# Patient Record
Sex: Female | Born: 1955 | Race: White | Hispanic: No | Marital: Married | Smoking: Former smoker
Health system: Southern US, Community
[De-identification: ages and names within clinical notes are randomized; demographics above are authoritative.]

## PROBLEM LIST (undated history)

## (undated) DIAGNOSIS — K219 Gastro-esophageal reflux disease without esophagitis: Secondary | ICD-10-CM

## (undated) DIAGNOSIS — E039 Hypothyroidism, unspecified: Secondary | ICD-10-CM

## (undated) DIAGNOSIS — Z9889 Other specified postprocedural states: Secondary | ICD-10-CM

## (undated) DIAGNOSIS — R112 Nausea with vomiting, unspecified: Secondary | ICD-10-CM

## (undated) DIAGNOSIS — F329 Major depressive disorder, single episode, unspecified: Secondary | ICD-10-CM

## (undated) DIAGNOSIS — G43909 Migraine, unspecified, not intractable, without status migrainosus: Secondary | ICD-10-CM

## (undated) DIAGNOSIS — M1990 Unspecified osteoarthritis, unspecified site: Secondary | ICD-10-CM

## (undated) DIAGNOSIS — F419 Anxiety disorder, unspecified: Secondary | ICD-10-CM

## (undated) DIAGNOSIS — J189 Pneumonia, unspecified organism: Secondary | ICD-10-CM

## (undated) DIAGNOSIS — I10 Essential (primary) hypertension: Secondary | ICD-10-CM

## (undated) DIAGNOSIS — Z973 Presence of spectacles and contact lenses: Secondary | ICD-10-CM

## (undated) DIAGNOSIS — F32A Depression, unspecified: Secondary | ICD-10-CM

## (undated) HISTORY — PX: COLONOSCOPY: SHX174

## (undated) HISTORY — PX: KNEE ARTHROSCOPY: SUR90

---

## 2001-04-03 HISTORY — PX: ABDOMINAL HYSTERECTOMY: SHX81

## 2002-04-03 HISTORY — PX: LAPAROSCOPIC CHOLECYSTECTOMY: SUR755

## 2004-02-10 ENCOUNTER — Ambulatory Visit: Payer: Self-pay | Admitting: Internal Medicine

## 2004-03-09 ENCOUNTER — Ambulatory Visit: Payer: Self-pay | Admitting: Internal Medicine

## 2012-04-03 HISTORY — PX: CARPAL TUNNEL RELEASE: SHX101

## 2018-05-14 NOTE — Progress Notes (Signed)
Please place orders in Epic as patient is being scheduled for a pre-op appointment! Thank you! 

## 2018-05-17 NOTE — Patient Instructions (Signed)
Lisabeth RegisterJudy Etienne  1956-01-17     Your procedure is scheduled on:  05-24-2018    [facility],  Report to admitting at  10:45 AM    Call this number if you have problems the morning of surgery [phone]        Remember: Do not eat food or drink liquids :After Midnight.    BRUSH YOUR TEETH MORNING OF SURGERY AND RINSE YOUR MOUTH OUT, NO CHEWING GUM CANDY OR MINTS.         Take these medicines the morning of surgery with A SIP OF WATER:  Premarin,  Lamotrigine (lamictal), Levothyroxine (synthroid), Omeprazole (prilosec)                                   You may not have any metal on your body including hair pins and               piercings  Do not wear jewelry, make-up, lotions, powders or perfumes, deodorant              Do not wear nail polish.  Do not shave  48 hours prior to surgery.                Do not bring valuables to the hospital. [facility] IS NOT             RESPONSIBLE   FOR VALUABLES.  Contacts, dentures or bridgework may not be worn into surgery.  Leave suitcase in the car. After surgery it may be brought to your room.     _____________________________________________________________________             [facility] - Preparing for Surgery Before surgery, you can play an important role.  Because skin is not sterile, your skin needs to be as free of germs as possible.  You can reduce the number of germs on your skin by washing with CHG (chlorahexidine gluconate) soap before surgery.  CHG is an antiseptic cleaner which kills germs and bonds with the skin to continue killing germs even after washing. Please DO NOT use if you have an allergy to CHG or antibacterial soaps.  If your skin becomes reddened/irritated stop using the CHG and inform your nurse when you arrive at Short Stay. Do not shave (including legs and underarms) for at least 48 hours prior to the first CHG shower.  You may shave your  face/neck. Please follow these instructions carefully:  1.  Shower with CHG Soap the night before surgery and the  morning of Surgery.  2.  If you choose to wash your hair, wash your hair first as usual with your  normal  shampoo.  3.  After you shampoo, rinse your hair and body thoroughly to remove the  shampoo.                            4.  Use CHG as you would any other liquid soap.  You can apply chg directly  to the skin and wash                       Gently with a scrungie or clean washcloth.  5.  Apply the CHG Soap to your body ONLY FROM THE  NECK DOWN.   Do not use on face/ open                           Wound or open sores. Avoid contact with eyes, ears mouth and genitals (private parts).                       Wash face,  Genitals (private parts) with your normal soap.             6.  Wash thoroughly, paying special attention to the area where your surgery  will be performed.  7.  Thoroughly rinse your body with warm water from the neck down.  8.  DO NOT shower/wash with your normal soap after using and rinsing off  the CHG Soap.             9.  Pat yourself dry with a clean towel.            10.  Wear clean pajamas.            11.  Place clean sheets on your bed the night of your first shower and do not  sleep with pets. Day of Surgery : Do not apply any lotions/deodorants the morning of surgery.  Please wear clean clothes to the hospital/surgery center.  FAILURE TO FOLLOW THESE INSTRUCTIONS MAY RESULT IN THE CANCELLATION OF YOUR SURGERY PATIENT SIGNATURE_________________________________  NURSE SIGNATURE__________________________________  ________________________________________________________________________   Rogelia Mire  An incentive spirometer is a tool that can help keep your lungs clear and active. This tool measures how well you are filling your lungs with each breath. Taking long deep breaths may help reverse or decrease the chance of developing breathing  (pulmonary) problems (especially infection) following:  A long period of time when you are unable to move or be active. BEFORE THE PROCEDURE   If the spirometer includes an indicator to show your best effort, your nurse or respiratory therapist will set it to a desired goal.  If possible, sit up straight or lean slightly forward. Try not to slouch.  Hold the incentive spirometer in an upright position. INSTRUCTIONS FOR USE  1. Sit on the edge of your bed if possible, or sit up as far as you can in bed or on a chair. 2. Hold the incentive spirometer in an upright position. 3. Breathe out normally. 4. Place the mouthpiece in your mouth and seal your lips tightly around it. 5. Breathe in slowly and as deeply as possible, raising the piston or the ball toward the top of the column. 6. Hold your breath for 3-5 seconds or for as long as possible. Allow the piston or ball to fall to the bottom of the column. 7. Remove the mouthpiece from your mouth and breathe out normally. 8. Rest for a few seconds and repeat Steps 1 through 7 at least 10 times every 1-2 hours when you are awake. Take your time and take a few normal breaths between deep breaths. 9. The spirometer may include an indicator to show your best effort. Use the indicator as a goal to work toward during each repetition. 10. After each set of 10 deep breaths, practice coughing to be sure your lungs are clear. If you have an incision (the cut made at the time of surgery), support your incision when coughing by placing a pillow or rolled up towels firmly against it. Once you are able to get out  of bed, walk around indoors and cough well. You may stop using the incentive spirometer when instructed by your caregiver.  RISKS AND COMPLICATIONS  Take your time so you do not get dizzy or light-headed.  If you are in pain, you may need to take or ask for pain medication before doing incentive spirometry. It is harder to take a deep breath if you  are having pain. AFTER USE  Rest and breathe slowly and easily.  It can be helpful to keep track of a log of your progress. Your caregiver can provide you with a simple table to help with this. If you are using the spirometer at home, follow these instructions: SEEK MEDICAL CARE IF:   You are having difficultly using the spirometer.  You have trouble using the spirometer as often as instructed.  Your pain medication is not giving enough relief while using the spirometer.  You develop fever of 100.5 F (38.1 C) or higher. SEEK IMMEDIATE MEDICAL CARE IF:   You cough up bloody sputum that had not been present before.  You develop fever of 102 F (38.9 C) or greater.  You develop worsening pain at or near the incision site. MAKE SURE YOU:   Understand these instructions.  Will watch your condition.  Will get help right away if you are not doing well or get worse. Document Released: 07/31/2006 Document Revised: 06/12/2011 Document Reviewed: 10/01/2006 Exit[employer] Patient Information 2014 ExitCare, [place].   ________________________________________________________________________  WHAT IS A BLOOD TRANSFUSION? Blood Transfusion Information  A transfusion is the replacement of blood or some of its parts. Blood is made up of multiple cells which provide different functions.  Red blood cells carry oxygen and are used for blood loss replacement.  White blood cells fight against infection.  Platelets control bleeding.  Plasma helps clot blood.  Other blood products are available for specialized needs, such as hemophilia or other clotting disorders. BEFORE THE TRANSFUSION  Who gives blood for transfusions?   Healthy volunteers who are fully evaluated to make sure their blood is safe. This is blood bank blood. Transfusion therapy is the safest it has ever been in the practice of medicine. Before blood is taken from a donor, a complete history is taken to make sure that person has  no history of diseases nor engages in risky social behavior (examples are intravenous drug use or sexual activity with multiple partners). The donor's travel history is screened to minimize risk of transmitting infections, such as malaria. The donated blood is tested for signs of infectious diseases, such as HIV and hepatitis. The blood is then tested to be sure it is compatible with you in order to minimize the chance of a transfusion reaction. If you or a relative donates blood, this is often done in anticipation of surgery and is not appropriate for emergency situations. It takes many days to process the donated blood. RISKS AND COMPLICATIONS Although transfusion therapy is very safe and saves many lives, the main dangers of transfusion include:   Getting an infectious disease.  Developing a transfusion reaction. This is an allergic reaction to something in the blood you were given. Every precaution is taken to prevent this. The decision to have a blood transfusion has been considered carefully by your caregiver before blood is given. Blood is not given unless the benefits outweigh the risks. AFTER THE TRANSFUSION  Right after receiving a blood transfusion, you will usually feel much better and more energetic. This is especially true if your red  blood cells have gotten low (anemic). The transfusion raises the level of the red blood cells which carry oxygen, and this usually causes an energy increase.  The nurse administering the transfusion will monitor you carefully for complications. HOME CARE INSTRUCTIONS  No special instructions are needed after a transfusion. You may find your energy is better. Speak with your caregiver about any limitations on activity for underlying diseases you may have. SEEK MEDICAL CARE IF:   Your condition is not improving after your transfusion.  You develop redness or irritation at the intravenous (IV) site. SEEK IMMEDIATE MEDICAL CARE IF:  Any of the following  symptoms occur over the next 12 hours:  Shaking chills.  You have a temperature by mouth above 102 F (38.9 C), not controlled by medicine.  Chest, back, or muscle pain.  People around you feel you are not acting correctly or are confused.  Shortness of breath or difficulty breathing.  Dizziness and fainting.  You get a rash or develop hives.  You have a decrease in urine output.  Your urine turns a dark color or changes to pink, red, or brown. Any of the following symptoms occur over the next 10 days:  You have a temperature by mouth above 102 F (38.9 C), not controlled by medicine.  Shortness of breath.  Weakness after normal activity.  The white part of the eye turns yellow (jaundice).  You have a decrease in the amount of urine or are urinating less often.  Your urine turns a dark color or changes to pink, red, or brown. Document Released: 03/17/2000 Document Revised: 06/12/2011 Document Reviewed: 11/04/2007 [facility] Patient Information 2014 [place], [place].  _______________________________________________________________________

## 2018-05-22 ENCOUNTER — Encounter (HOSPITAL_COMMUNITY)
Admission: RE | Admit: 2018-05-22 | Discharge: 2018-05-22 | Disposition: A | Discharge status: Home / Self Care | Source: Ambulatory Visit | Attending: Orthopedic Surgery | Admitting: Orthopedic Surgery

## 2018-05-22 ENCOUNTER — Encounter (HOSPITAL_COMMUNITY): Payer: Self-pay | Admitting: *Deleted

## 2018-05-22 ENCOUNTER — Other Ambulatory Visit: Payer: Self-pay

## 2018-05-22 ENCOUNTER — Encounter (HOSPITAL_COMMUNITY): Payer: Self-pay | Admitting: Physician Assistant

## 2018-05-22 DIAGNOSIS — Z01818 Encounter for other preprocedural examination: Secondary | ICD-10-CM | POA: Diagnosis present

## 2018-05-22 HISTORY — DX: Migraine, unspecified, not intractable, without status migrainosus: G43.909

## 2018-05-22 HISTORY — DX: Gastro-esophageal reflux disease without esophagitis: K21.9

## 2018-05-22 HISTORY — DX: Other specified postprocedural states: Z98.890

## 2018-05-22 HISTORY — DX: Other specified postprocedural states: R11.2

## 2018-05-22 HISTORY — DX: Unspecified osteoarthritis, unspecified site: M19.90

## 2018-05-22 HISTORY — DX: Essential (primary) hypertension: I10

## 2018-05-22 HISTORY — DX: Presence of spectacles and contact lenses: Z97.3

## 2018-05-22 HISTORY — DX: Hypothyroidism, unspecified: E03.9

## 2018-05-22 LAB — CBC WITH DIFFERENTIAL/PLATELET
Abs Immature Granulocytes: 0.03 10*3/uL (ref 0.00–0.07)
Basophils Absolute: 0.1 10*3/uL (ref 0.0–0.1)
Basophils Relative: 1 %
Eosinophils Absolute: 0.2 10*3/uL (ref 0.0–0.5)
Eosinophils Relative: 3 %
HCT: 42.4 % (ref 36.0–46.0)
Hemoglobin: 13.7 g/dL (ref 12.0–15.0)
Immature Granulocytes: 1 %
Lymphocytes Relative: 30 %
Lymphs Abs: 1.9 10*3/uL (ref 0.7–4.0)
MCH: 32.4 pg (ref 26.0–34.0)
MCHC: 32.3 g/dL (ref 30.0–36.0)
MCV: 100.2 fL — ABNORMAL HIGH (ref 80.0–100.0)
Monocytes Absolute: 0.7 10*3/uL (ref 0.1–1.0)
Monocytes Relative: 11 %
Neutro Abs: 3.4 10*3/uL (ref 1.7–7.7)
Neutrophils Relative %: 54 %
Platelets: 198 10*3/uL (ref 150–400)
RBC: 4.23 MIL/uL (ref 3.87–5.11)
RDW: 12.5 % (ref 11.5–15.5)
WBC: 6.2 10*3/uL (ref 4.0–10.5)
nRBC: 0 % (ref 0.0–0.2)

## 2018-05-22 LAB — URINALYSIS, ROUTINE W REFLEX MICROSCOPIC
Bilirubin Urine: NEGATIVE
Glucose, UA: NEGATIVE mg/dL
Hgb urine dipstick: NEGATIVE
Ketones, ur: NEGATIVE mg/dL
Leukocytes,Ua: NEGATIVE
Nitrite: NEGATIVE
Protein, ur: NEGATIVE mg/dL
Specific Gravity, Urine: 1.009 (ref 1.005–1.030)
pH: 7 (ref 5.0–8.0)

## 2018-05-22 LAB — PROTIME-INR
INR: 1.01
Prothrombin Time: 13.2 seconds (ref 11.4–15.2)

## 2018-05-22 LAB — APTT: aPTT: 31 seconds (ref 24–36)

## 2018-05-22 LAB — SURGICAL PCR SCREEN
MRSA, PCR: NEGATIVE
Staphylococcus aureus: NEGATIVE

## 2018-05-22 LAB — ABO/RH: ABO/RH(D): A POS

## 2018-05-22 NOTE — Progress Notes (Addendum)
Per pt had lab work and ekg done at [facility], [facility], yesterday 05-21-2018.  Called via phone and requested lab results and ekg.  Per office no ekg in pt chart but lab work was done (told [facility],cmp was done) , to fax results.  Received lab results dated 05-21-2018 and placed with chart.  ADDENDUM:  Final EKG dated 05-22-2018  In epic.

## 2018-05-23 MED ORDER — BUPIVACAINE LIPOSOME 1.3 % IJ SUSP
20.0000 mL | INTRAMUSCULAR | Status: DC
  Filled 2018-05-23: qty 20

## 2018-05-24 ENCOUNTER — Inpatient Hospital Stay (HOSPITAL_COMMUNITY)
Admission: RE | Admit: 2018-05-24 | Payer: PRIVATE HEALTH INSURANCE | Source: Home / Self Care | Admitting: Orthopedic Surgery

## 2018-05-24 ENCOUNTER — Encounter (HOSPITAL_COMMUNITY): Admission: RE | Payer: Self-pay | Source: Home / Self Care

## 2018-05-24 LAB — TYPE AND SCREEN
ABO/RH(D): A POS
Antibody Screen: NEGATIVE

## 2018-05-24 SURGERY — ARTHROPLASTY, KNEE, TOTAL
Anesthesia: Choice | Laterality: Left

## 2018-05-27 ENCOUNTER — Ambulatory Visit (INDEPENDENT_AMBULATORY_CARE_PROVIDER_SITE_OTHER): Admitting: Allergy and Immunology

## 2018-05-27 ENCOUNTER — Encounter: Payer: Self-pay | Admitting: Allergy and Immunology

## 2018-05-27 VITALS — BP 104/68 | HR 84 | Temp 98.5°F | Resp 22 | Ht 62.2 in | Wt 178.0 lb

## 2018-05-27 DIAGNOSIS — L230 Allergic contact dermatitis due to metals: Secondary | ICD-10-CM | POA: Diagnosis not present

## 2018-05-27 NOTE — Progress Notes (Signed)
Dear Dimitri Ped,  Thank you for referring Kaili Bieber to the [facility] on 05/27/2018.   Below is a summation of this patient's evaluation and recommendations.  Thank you for your referral. I will keep you informed about this patient's response to treatment.   If you have any questions please do not hesitate to contact me.   Sincerely,  Jessica Priest, MD Allergy / Immunology [facility]   ______________________________________________________________________    NEW PATIENT NOTE  Referring Provider: Dimitri Ped, PA-C Primary Provider: Marylen Ponto, MD Date of office visit: 05/27/2018    Subjective:   Chief Complaint:  Kathryn Hicks (DOB: [birth year]) is a 63 y.o. female who presents to the clinic on 05/27/2018 with a chief complaint of Other (Possible reaction to metals.) .     HPI: Maylei presents to this clinic in evaluation of possible metal allergy.  She will be having a knee replacement sometime in the near future.  She has a history of developing red swollen scaly blistered areas when using various types of jewelry.  She has found that she can only wear gold jewelry at this point in time.  Past Medical History:  Diagnosis Date  . GERD (gastroesophageal reflux disease)   . Hypertension   . Hypothyroidism    followed by pcp  . Migraines   . OA (osteoarthritis)    knees  . PONV (postoperative nausea and vomiting)   . Wears glasses     Past Surgical History:  Procedure Laterality Date  . ABDOMINAL HYSTERECTOMY  2003    w/  Bilateral Salpingo-oophorectomy  . CARPAL TUNNEL RELEASE Left 2014  . CESAREAN SECTION  x2  last one 17  . KNEE ARTHROSCOPY Left 06-21-2017   dr Darrelyn Hillock  . LAPAROSCOPIC CHOLECYSTECTOMY  2004    Allergies as of 05/27/2018      Reactions   Butalbital Hives   Other reaction(s): Other (See Comments) unknown   Sporanos  [itraconazole] Hives, Rash   Welts full body      Medication List      almotriptan 12.5 MG tablet Commonly known as:  AXERT Take 12.5 mg by mouth as needed for migraine. may repeat in 2 hours if needed   ALPRAZolam 1 MG tablet Commonly known as:  XANAX Take 0.5 mg by mouth at bedtime as needed for anxiety.   Biotin 5000 MCG Tabs Take 5,000 mcg by mouth daily.   calcium carbonate 1250 (500 Ca) MG tablet Commonly known as:  OS-CAL - dosed in mg of elemental calcium Take 1 tablet by mouth daily with breakfast.   cholecalciferol 25 MCG (1000 UT) tablet Commonly known as:  VITAMIN D3 Take 1,000 Units by mouth daily.   esomeprazole 20 MG capsule Commonly known as:  NEXIUM Take 20 mg by mouth daily as needed.   estrogens (conjugated) 0.9 MG tablet Commonly known as:  PREMARIN Take 0.9 mg by mouth daily.   lamoTRIgine 150 MG tablet Commonly known as:  LAMICTAL Take 150 mg by mouth 2 (two) times daily.   levothyroxine 25 MCG tablet Commonly known as:  SYNTHROID, LEVOTHROID Take 25 mcg by mouth daily before breakfast.   multivitamin with minerals Tabs tablet Take 1 tablet by mouth daily.   olmesartan-hydrochlorothiazide 40-25 MG tablet Commonly known as:  BENICAR HCT Take 1 tablet by mouth daily.   OSTEO BI-FLEX TRIPLE STRENGTH PO Take 1 tablet by mouth 2 (two)  times daily.   pseudoephedrine 120 MG 12 hr tablet Commonly known as:  SUDAFED Take 120 mg by mouth every 12 (twelve) hours as needed for congestion.   vitamin C 1000 MG tablet Take 1,000 mg by mouth daily.       Review of systems negative except as noted in HPI / PMHx or noted below:  Review of Systems  Constitutional: Negative.   HENT: Negative.   Eyes: Negative.   Respiratory: Negative.   Cardiovascular: Negative.   Gastrointestinal: Negative.   Genitourinary: Negative.   Musculoskeletal: Negative.   Skin: Negative.   Neurological: Negative.   Endo/Heme/Allergies: Negative.     Psychiatric/Behavioral: Negative.     Family History  Problem Relation Age of Onset  . Prostate cancer Father   . Heart attack Father   . Wilm's tumor Daughter     Social History   Socioeconomic History  . Marital status: Married    Spouse name: Not on file  . Number of children: Not on file  . Years of education: Not on file  . Highest education level: Not on file  Occupational History  . Not on file  Social Needs  . Financial resource strain: Not on file  . Food insecurity:    Worry: Not on file    Inability: Not on file  . Transportation needs:    Medical: Not on file    Non-medical: Not on file  Tobacco Use  . Smoking status: Former Smoker    Years: 10.00    Types: Cigarettes    Last attempt to quit: 05/22/1977    Years since quitting: 41.0  . Smokeless tobacco: Never Used  Substance and Sexual Activity  . Alcohol use: Never    Frequency: Never  . Drug use: Not Currently    Comment: per pt in age 17s,  none since  . Sexual activity: Not on file  Lifestyle  . Physical activity:    Days per week: Not on file    Minutes per session: Not on file  . Stress: Not on file  Relationships  . Social connections:    Talks on phone: Not on file    Gets together: Not on file    Attends religious service: Not on file    Active member of club or organization: Not on file    Attends meetings of clubs or organizations: Not on file    Relationship status: Not on file  . Intimate partner violence:    Fear of current or ex partner: Not on file    Emotionally abused: Not on file    Physically abused: Not on file    Forced sexual activity: Not on file  Other Topics Concern  . Not on file  Social History Narrative  . Not on file    Environmental and Social history  Lives in a house with a dry environment, dogs located inside the household, carpet in the bedroom, plastic on the bed, no plastic on the pillow, no smokers located inside the household.  Objective:    Vitals:   05/27/18 0948  BP: 104/68  Pulse: 84  Resp: (!) 22  Temp: 98.5 F (36.9 C)  SpO2: 96%   Height: 5' 2.2" (158 cm) Weight: 178 lb (80.7 kg)  Physical Exam Constitutional:      Appearance: She is not diaphoretic.  HENT:     Head: Normocephalic. No right periorbital erythema or left periorbital erythema.     Right Ear: Tympanic membrane, ear canal  and external ear normal.     Left Ear: Tympanic membrane, ear canal and external ear normal.     Nose: Nose normal. No mucosal edema or rhinorrhea.     Mouth/Throat:     Pharynx: No oropharyngeal exudate.  Eyes:     General: Lids are normal.     Conjunctiva/sclera: Conjunctivae normal.     Pupils: Pupils are equal, round, and reactive to light.  Neck:     Thyroid: No thyromegaly.     Trachea: Trachea normal. No tracheal deviation.  Cardiovascular:     Rate and Rhythm: Normal rate and regular rhythm.     Heart sounds: Normal heart sounds, S1 normal and S2 normal. No murmur.  Pulmonary:     Effort: Pulmonary effort is normal. No respiratory distress.     Breath sounds: No stridor. No wheezing or rales.  Chest:     Chest wall: No tenderness.  Abdominal:     General: There is no distension.     Palpations: Abdomen is soft. There is no mass.     Tenderness: There is no abdominal tenderness. There is no guarding or rebound.  Musculoskeletal:        General: No tenderness.  Lymphadenopathy:     Head:     Right side of head: No tonsillar adenopathy.     Left side of head: No tonsillar adenopathy.     Cervical: No cervical adenopathy.  Skin:    Coloration: Skin is not pale.     Findings: No erythema or rash.     Nails: There is no clubbing.   Neurological:     Mental Status: She is alert.     Diagnostics: Metal patch testing was applied to back and initial read will take place in 48 hours.  Assessment and Plan:    1. Allergic contact dermatitis due to metals     Kemoria has had metal patch testing placed on  her back and she will return to this clinic in 48 hours for her initial read.  Further recommendations will be forthcoming based upon the results of this testing.   Jessica Priest, MD Allergy / Immunology [facility] [place]

## 2018-05-28 ENCOUNTER — Encounter: Payer: Self-pay | Admitting: Allergy and Immunology

## 2018-05-29 ENCOUNTER — Encounter: Payer: Self-pay | Admitting: Allergy and Immunology

## 2018-05-29 ENCOUNTER — Ambulatory Visit: Admitting: Allergy and Immunology

## 2018-05-29 DIAGNOSIS — L230 Allergic contact dermatitis due to metals: Secondary | ICD-10-CM

## 2018-05-30 ENCOUNTER — Encounter: Payer: Self-pay | Admitting: Allergy and Immunology

## 2018-05-30 NOTE — Progress Notes (Signed)
Kathryn Hicks returns to this clinic in reevaluation of her possible contact allergy directed against metal.  She is here today to have her 48-hour patch test read performed.  There were no cutaneous reactions to any of the metals placed on her back 48 hours ago.  She will return to this clinic for a 7-day patch test read.

## 2018-06-03 ENCOUNTER — Encounter: Payer: Self-pay | Admitting: Allergy and Immunology

## 2018-06-03 ENCOUNTER — Ambulatory Visit: Admitting: Allergy and Immunology

## 2018-06-03 DIAGNOSIS — L230 Allergic contact dermatitis due to metals: Secondary | ICD-10-CM

## 2018-06-03 NOTE — Progress Notes (Signed)
Derisha returns to this clinic to have a 7-day patch test read completed.  There was no evidence of any reaction directed against chromium chloride 1%, cobalt chloride hexahydrate 1%, Molybdenum chloride 0.5%, Tantal 1%, nickel sulfate hexahydrate 5%, potassium dichromate 0.25%, copper sulfate Penta hydrate 2%, titanium 0.1%, manganese chloride 0.5%.

## 2018-06-04 ENCOUNTER — Other Ambulatory Visit: Payer: Self-pay | Admitting: Surgical

## 2018-06-05 ENCOUNTER — Encounter (HOSPITAL_COMMUNITY): Payer: Self-pay

## 2018-06-05 NOTE — Patient Instructions (Signed)
Your procedure is scheduled on: Tuesday, June 11, 2018   Surgery Time:  12:45PM-2:45PM   Report to [facility] Main  Entrance    Report to admitting at 10:15 AM   Call this number if you have problems the morning of surgery [phone]   Do not eat food or drink liquids :After Midnight.   Brush your teeth the morning of surgery.   Do NOT smoke after Midnight   Take these medicines the morning of surgery with A SIP OF WATER: Esomeprazole, Premarin, Lamotrigine, Levothyroxine                               You may not have any metal on your body including hair pins, jewelry, and body piercings             Do not wear make-up, lotions, powders, perfumes/cologne, or deodorant             Do not wear nail polish.  Do not shave  48 hours prior to surgery.               Do not bring valuables to the hospital. [facility] IS NOT             RESPONSIBLE   FOR VALUABLES.   Contacts, dentures or bridgework may not be worn into surgery.   Leave suitcase in the car. After surgery it may be brought to your room.   Special Instructions: Bring a copy of your healthcare power of attorney and living will documents         the day of surgery if you haven't scanned them in before.              Please read over the following fact sheets you were given:  [facility] - Preparing for Surgery Before surgery, you can play an important role.  Because skin is not sterile, your skin needs to be as free of germs as possible.  You can reduce the number of germs on your skin by washing with CHG (chlorahexidine gluconate) soap before surgery.  CHG is an antiseptic cleaner which kills germs and bonds with the skin to continue killing germs even after washing. Please DO NOT use if you have an allergy to CHG or antibacterial soaps.  If your skin becomes reddened/irritated stop using the CHG and inform your nurse when you arrive at Short Stay. Do not shave (including legs and underarms) for at least  48 hours prior to the first CHG shower.  You may shave your face/neck.  Please follow these instructions carefully:  1.  Shower with CHG Soap the night before surgery and the  morning of surgery.  2.  If you choose to wash your hair, wash your hair first as usual with your normal  shampoo.  3.  After you shampoo, rinse your hair and body thoroughly to remove the shampoo.                             4.  Use CHG as you would any other liquid soap.  You can apply chg directly to the skin and wash.  Gently with a scrungie or clean washcloth.  5.  Apply the CHG Soap to your body ONLY FROM THE NECK DOWN.   Do   not use on face/ open  Wound or open sores. Avoid contact with eyes, ears mouth and   genitals (private parts).                       Wash face,  Genitals (private parts) with your normal soap.             6.  Wash thoroughly, paying special attention to the area where your    surgery  will be performed.  7.  Thoroughly rinse your body with warm water from the neck down.  8.  DO NOT shower/wash with your normal soap after using and rinsing off the CHG Soap.                9.  Pat yourself dry with a clean towel.            10.  Wear clean pajamas.            11.  Place clean sheets on your bed the night of your first shower and do not  sleep with pets. Day of Surgery : Do not apply any lotions/deodorants the morning of surgery.  Please wear clean clothes to the hospital/surgery center.  FAILURE TO FOLLOW THESE INSTRUCTIONS MAY RESULT IN THE CANCELLATION OF YOUR SURGERY  PATIENT SIGNATURE_________________________________  NURSE SIGNATURE__________________________________  ________________________________________________________________________   Adam Phenix  An incentive spirometer is a tool that can help keep your lungs clear and active. This tool measures how well you are filling your lungs with each breath. Taking long deep breaths may help reverse  or decrease the chance of developing breathing (pulmonary) problems (especially infection) following:  A long period of time when you are unable to move or be active. BEFORE THE PROCEDURE   If the spirometer includes an indicator to show your best effort, your nurse or respiratory therapist will set it to a desired goal.  If possible, sit up straight or lean slightly forward. Try not to slouch.  Hold the incentive spirometer in an upright position. INSTRUCTIONS FOR USE  1. Sit on the edge of your bed if possible, or sit up as far as you can in bed or on a chair. 2. Hold the incentive spirometer in an upright position. 3. Breathe out normally. 4. Place the mouthpiece in your mouth and seal your lips tightly around it. 5. Breathe in slowly and as deeply as possible, raising the piston or the ball toward the top of the column. 6. Hold your breath for 3-5 seconds or for as long as possible. Allow the piston or ball to fall to the bottom of the column. 7. Remove the mouthpiece from your mouth and breathe out normally. 8. Rest for a few seconds and repeat Steps 1 through 7 at least 10 times every 1-2 hours when you are awake. Take your time and take a few normal breaths between deep breaths. 9. The spirometer may include an indicator to show your best effort. Use the indicator as a goal to work toward during each repetition. 10. After each set of 10 deep breaths, practice coughing to be sure your lungs are clear. If you have an incision (the cut made at the time of surgery), support your incision when coughing by placing a pillow or rolled up towels firmly against it. Once you are able to get out of bed, walk around indoors and cough well. You may stop using the incentive spirometer when instructed by your caregiver.  RISKS AND COMPLICATIONS  Take your time  so you do not get dizzy or light-headed.  If you are in pain, you may need to take or ask for pain medication before doing incentive  spirometry. It is harder to take a deep breath if you are having pain. AFTER USE  Rest and breathe slowly and easily.  It can be helpful to keep track of a log of your progress. Your caregiver can provide you with a simple table to help with this. If you are using the spirometer at home, follow these instructions: [place] IF:   You are having difficultly using the spirometer.  You have trouble using the spirometer as often as instructed.  Your pain medication is not giving enough relief while using the spirometer.  You develop fever of 100.5 F (38.1 C) or higher. SEEK IMMEDIATE MEDICAL CARE IF:   You cough up bloody sputum that had not been present before.  You develop fever of 102 F (38.9 C) or greater.  You develop worsening pain at or near the incision site. MAKE SURE YOU:   Understand these instructions.  Will watch your condition.  Will get help right away if you are not doing well or get worse. Document Released: 07/31/2006 Document Revised: 06/12/2011 Document Reviewed: 10/01/2006 Exit[employer] Patient Information 2014 Exit[employer], [place].   ________________________________________________________________________  WHAT IS A BLOOD TRANSFUSION? Blood Transfusion Information  A transfusion is the replacement of blood or some of its parts. Blood is made up of multiple cells which provide different functions.  Red blood cells carry oxygen and are used for blood loss replacement.  White blood cells fight against infection.  Platelets control bleeding.  Plasma helps clot blood.  Other blood products are available for specialized needs, such as hemophilia or other clotting disorders. BEFORE THE TRANSFUSION  Who gives blood for transfusions?   Healthy volunteers who are fully evaluated to make sure their blood is safe. This is blood bank blood. Transfusion therapy is the safest it has ever been in the practice of medicine. Before blood is taken from a donor, a  complete history is taken to make sure that person has no history of diseases nor engages in risky social behavior (examples are intravenous drug use or sexual activity with multiple partners). The donor's travel history is screened to minimize risk of transmitting infections, such as malaria. The donated blood is tested for signs of infectious diseases, such as HIV and hepatitis. The blood is then tested to be sure it is compatible with you in order to minimize the chance of a transfusion reaction. If you or a relative donates blood, this is often done in anticipation of surgery and is not appropriate for emergency situations. It takes many days to process the donated blood. RISKS AND COMPLICATIONS Although transfusion therapy is very safe and saves many lives, the main dangers of transfusion include:   Getting an infectious disease.  Developing a transfusion reaction. This is an allergic reaction to something in the blood you were given. Every precaution is taken to prevent this. The decision to have a blood transfusion has been considered carefully by your caregiver before blood is given. Blood is not given unless the benefits outweigh the risks. AFTER THE TRANSFUSION  Right after receiving a blood transfusion, you will usually feel much better and more energetic. This is especially true if your red blood cells have gotten low (anemic). The transfusion raises the level of the red blood cells which carry oxygen, and this usually causes an energy increase.  The  nurse administering the transfusion will monitor you carefully for complications. HOME CARE INSTRUCTIONS  No special instructions are needed after a transfusion. You may find your energy is better. Speak with your caregiver about any limitations on activity for underlying diseases you may have. SEEK MEDICAL CARE IF:   Your condition is not improving after your transfusion.  You develop redness or irritation at the intravenous (IV)  site. SEEK IMMEDIATE MEDICAL CARE IF:  Any of the following symptoms occur over the next 12 hours:  Shaking chills.  You have a temperature by mouth above 102 F (38.9 C), not controlled by medicine.  Chest, back, or muscle pain.  People around you feel you are not acting correctly or are confused.  Shortness of breath or difficulty breathing.  Dizziness and fainting.  You get a rash or develop hives.  You have a decrease in urine output.  Your urine turns a dark color or changes to pink, red, or brown. Any of the following symptoms occur over the next 10 days:  You have a temperature by mouth above 102 F (38.9 C), not controlled by medicine.  Shortness of breath.  Weakness after normal activity.  The white part of the eye turns yellow (jaundice).  You have a decrease in the amount of urine or are urinating less often.  Your urine turns a dark color or changes to pink, red, or brown. Document Released: 03/17/2000 Document Revised: 06/12/2011 Document Reviewed: 11/04/2007 [facility] Patient Information 2014 [place], [place].  _______________________________________________________________________

## 2018-06-05 NOTE — Pre-Procedure Instructions (Signed)
EKG 05/22/2018 in epic Dr. Leonor Liv 05/21/2018 in chart

## 2018-06-05 NOTE — H&P (Signed)
TOTAL KNEE ADMISSION H&P  Patient is being admitted for left total knee arthroplasty.  Subjective:  Chief Complaint:left knee pain.  HPI: Kathryn Hicks, 63 y.o. female, has a history of pain and functional disability in the left knee due to arthritis and has failed non-surgical conservative treatments for greater than 12 weeks to includeNSAID's and/or analgesics, corticosteriod injections, flexibility and strengthening excercises and activity modification.  Onset of symptoms was gradual, starting 2 years ago with gradually worsening course since that time. The patient noted prior procedures on the knee to include  arthroscopy and menisectomy on the left knee(s).  Patient currently rates pain in the left knee(s) at 7 out of 10 with activity. Patient has night pain, worsening of pain with activity and weight bearing, pain that interferes with activities of daily living, pain with passive range of motion, crepitus and joint swelling.  Patient has evidence of periarticular osteophytes and joint space narrowing by imaging studies.  There is no active infection.   Past Medical History:  Diagnosis Date  . Anxiety   . Depression   . GERD (gastroesophageal reflux disease)   . Hypertension   . Hypothyroidism    followed by pcp  . Migraines   . OA (osteoarthritis)    knees  . PONV (postoperative nausea and vomiting)   . Wears glasses     Past Surgical History:  Procedure Laterality Date  . ABDOMINAL HYSTERECTOMY  2003    w/  Bilateral Salpingo-oophorectomy  . CARPAL TUNNEL RELEASE Left 2014  . CESAREAN SECTION  x2  last one 70  . KNEE ARTHROSCOPY Left 06-21-2017   dr Darrelyn Hillock  . LAPAROSCOPIC CHOLECYSTECTOMY  2004       Current Outpatient Medications  Medication Sig Dispense Refill Last Dose  . almotriptan (AXERT) 12.5 MG tablet Take 12.5 mg by mouth as needed for migraine. may repeat in 2 hours if needed   Taking  . ALPRAZolam (XANAX) 1 MG tablet Take 0.5 mg by mouth at bedtime as needed  for anxiety.   Taking  . Ascorbic Acid (VITAMIN C) 1000 MG tablet Take 1,000 mg by mouth daily.   Taking  . Biotin 5000 MCG TABS Take 5,000 mcg by mouth daily.    Taking  . calcium carbonate (OS-CAL - DOSED IN MG OF ELEMENTAL CALCIUM) 1250 (500 Ca) MG tablet Take 1 tablet by mouth daily with breakfast.   Taking  . cholecalciferol (VITAMIN D3) 25 MCG (1000 UT) tablet Take 1,000 Units by mouth daily.   Taking  . esomeprazole (NEXIUM) 20 MG capsule Take 20 mg by mouth daily as needed.    Taking  . estrogens, conjugated, (PREMARIN) 0.9 MG tablet Take 0.9 mg by mouth daily.    Taking  . lamoTRIgine (LAMICTAL) 150 MG tablet Take 150 mg by mouth 2 (two) times daily.   Taking  . levothyroxine (SYNTHROID, LEVOTHROID) 25 MCG tablet Take 25 mcg by mouth daily before breakfast.   Taking  . Misc Natural Products (OSTEO BI-FLEX TRIPLE STRENGTH PO) Take 1 tablet by mouth 2 (two) times daily.   Taking  . Multiple Vitamin (MULTIVITAMIN WITH MINERALS) TABS tablet Take 1 tablet by mouth daily.   Taking  . olmesartan-hydrochlorothiazide (BENICAR HCT) 40-25 MG tablet Take 1 tablet by mouth daily.   Taking  . pseudoephedrine (SUDAFED) 120 MG 12 hr tablet Take 120 mg by mouth every 12 (twelve) hours as needed for congestion.   Taking   Allergies  Allergen Reactions  . Butalbital Hives  Other reaction(s): Other (See Comments) unknown  . Sporanos [Itraconazole] Hives and Rash    Welts full body    Social History   Tobacco Use  . Smoking status: Former Smoker    Years: 10.00    Types: Cigarettes    Last attempt to quit: 05/22/1977    Years since quitting: 41.0  . Smokeless tobacco: Never Used  Substance Use Topics  . Alcohol use: Never    Frequency: Never    Family History  Problem Relation Age of Onset  . Prostate cancer Father   . Heart attack Father   . Wilm's tumor Daughter      Review of Systems  Constitutional: Negative.   HENT: Negative.   Eyes: Negative.   Respiratory: Negative.    Cardiovascular: Negative.   Gastrointestinal: Positive for heartburn. Negative for abdominal pain, blood in stool, constipation, diarrhea, melena, nausea and vomiting.  Genitourinary: Negative.   Musculoskeletal: Positive for joint pain and myalgias. Negative for back pain, falls and neck pain.  Skin: Negative.   Neurological: Negative.   Endo/Heme/Allergies: Negative.   Psychiatric/Behavioral: Positive for depression. Negative for hallucinations, memory loss, substance abuse and suicidal ideas. The patient is nervous/anxious. The patient does not have insomnia.     Objective:  Physical Exam  Constitutional: She is oriented to person, place, and time. She appears well-developed. No distress.  Obese  HENT:  Head: Normocephalic and atraumatic.  Right Ear: External ear normal.  Left Ear: External ear normal.  Nose: Nose normal.  Mouth/Throat: Oropharynx is clear and moist.  Eyes: Conjunctivae and EOM are normal.  Neck: Normal range of motion. Neck supple.  Cardiovascular: Normal rate, regular rhythm, normal heart sounds and intact distal pulses.  No murmur heard. Respiratory: Effort normal and breath sounds normal. No respiratory distress. She has no wheezes.  GI: Soft. Bowel sounds are normal. She exhibits no distension. There is no abdominal tenderness.  Musculoskeletal:     Right knee: Normal.     Comments: Moderate tenderness to palpation about the medial and lateral joint line of the left knee. ROM left knee 5-120 degrees degrees. No effusion noted in the left knee. No instability about the left knee. Moderate patellofemoral crepitus in the left knee. Normal painless motion of the hips.   Neurological: She is alert and oriented to person, place, and time. She has normal strength. No sensory deficit.  Skin: No rash noted. She is not diaphoretic. No erythema.  Psychiatric: She has a normal mood and affect. Her behavior is normal.    Vital Signs Ht: 5 ft 2.5 in Wt: 177  lbs BMI: 31.9 BP: 118/76 HR:  72 bpm  Imaging Review Plain radiographs demonstrate severe degenerative joint disease of the left knee(s). The overall alignment ismild varus. The bone quality appears to be good for age and reported activity level.    Assessment/Plan:  End stage primary osteoarthritis, left knee   The patient history, physical examination, clinical judgment of the provider and imaging studies are consistent with end stage degenerative joint disease of the left knee(s) and total knee arthroplasty is deemed medically necessary. The treatment options including medical management, injection therapy arthroscopy and arthroplasty were discussed at length. The risks and benefits of total knee arthroplasty were presented and reviewed. The risks due to aseptic loosening, infection, stiffness, patella tracking problems, thromboembolic complications and other imponderables were discussed. The patient acknowledged the explanation, agreed to proceed with the plan and consent was signed. Patient is being admitted for inpatient treatment  for surgery, pain control, PT, OT, prophylactic antibiotics, VTE prophylaxis, progressive ambulation and ADL's and discharge planning. The patient is planning to be discharged home with outpatient therapy.  Therapy Plans: outpatient therapy at [facility] in [place] Disposition: Home with husband Planned DVT prophylaxis: aspirin  BID DME needed: walker PCP: [facility] Other: no aensthesia concerns  Anticipated LOS equal to or greater than 2 midnights due to - Age 79 and older with one or more of the following:  - Obesity  - Expected need for hospital services (PT, OT, Nursing) required for safe  discharge  - Anticipated need for postoperative skilled nursing care or inpatient rehab  - Active co-morbidities: None OR   - Unanticipated findings during/Post Surgery: None  - Patient is a high risk of re-admission due to: None   [employer], PA-C

## 2018-06-06 ENCOUNTER — Encounter (HOSPITAL_COMMUNITY)
Admission: RE | Admit: 2018-06-06 | Discharge: 2018-06-06 | Disposition: A | Discharge status: Home / Self Care | Source: Ambulatory Visit | Attending: Orthopedic Surgery | Admitting: Orthopedic Surgery

## 2018-06-06 ENCOUNTER — Encounter (HOSPITAL_COMMUNITY): Payer: Self-pay

## 2018-06-06 ENCOUNTER — Other Ambulatory Visit: Payer: Self-pay

## 2018-06-06 DIAGNOSIS — Z01812 Encounter for preprocedural laboratory examination: Secondary | ICD-10-CM | POA: Diagnosis present

## 2018-06-06 HISTORY — DX: Anxiety disorder, unspecified: F41.9

## 2018-06-06 HISTORY — DX: Depression, unspecified: F32.A

## 2018-06-06 HISTORY — DX: Major depressive disorder, single episode, unspecified: F32.9

## 2018-06-06 LAB — COMPREHENSIVE METABOLIC PANEL
ALT: 44 U/L (ref 0–44)
AST: 50 U/L — ABNORMAL HIGH (ref 15–41)
Albumin: 4 g/dL (ref 3.5–5.0)
Alkaline Phosphatase: 53 U/L (ref 38–126)
Anion gap: 7 (ref 5–15)
BUN: 11 mg/dL (ref 8–23)
CO2: 29 mmol/L (ref 22–32)
Calcium: 9.4 mg/dL (ref 8.9–10.3)
Chloride: 101 mmol/L (ref 98–111)
Creatinine, Ser: 0.82 mg/dL (ref 0.44–1.00)
GFR calc Af Amer: >60 mL/min (ref >60)
GFR calc non Af Amer: >60 mL/min (ref >60)
Glucose, Bld: 85 mg/dL (ref 70–99)
Potassium: 3.9 mmol/L (ref 3.5–5.1)
Sodium: 137 mmol/L (ref 135–145)
Total Bilirubin: 0.7 mg/dL (ref 0.3–1.2)
Total Protein: 7.3 g/dL (ref 6.5–8.1)

## 2018-06-06 LAB — CBC WITH DIFFERENTIAL/PLATELET
Abs Immature Granulocytes: 0.05 10*3/uL (ref 0.00–0.07)
Basophils Absolute: 0.1 10*3/uL (ref 0.0–0.1)
Basophils Relative: 1 %
Eosinophils Absolute: 0.2 10*3/uL (ref 0.0–0.5)
Eosinophils Relative: 3 %
HCT: 43.4 % (ref 36.0–46.0)
Hemoglobin: 13.9 g/dL (ref 12.0–15.0)
Immature Granulocytes: 1 %
Lymphocytes Relative: 39 %
Lymphs Abs: 2.8 10*3/uL (ref 0.7–4.0)
MCH: 32 pg (ref 26.0–34.0)
MCHC: 32 g/dL (ref 30.0–36.0)
MCV: 99.8 fL (ref 80.0–100.0)
Monocytes Absolute: 0.9 10*3/uL (ref 0.1–1.0)
Monocytes Relative: 12 %
Neutro Abs: 3.2 10*3/uL (ref 1.7–7.7)
Neutrophils Relative %: 44 %
Platelets: 211 10*3/uL (ref 150–400)
RBC: 4.35 MIL/uL (ref 3.87–5.11)
RDW: 12.3 % (ref 11.5–15.5)
WBC: 7.1 10*3/uL (ref 4.0–10.5)
nRBC: 0 % (ref 0.0–0.2)

## 2018-06-06 LAB — APTT: aPTT: 31 seconds (ref 24–36)

## 2018-06-06 LAB — URINALYSIS, ROUTINE W REFLEX MICROSCOPIC
Bilirubin Urine: NEGATIVE
Glucose, UA: NEGATIVE mg/dL
Hgb urine dipstick: NEGATIVE
Ketones, ur: NEGATIVE mg/dL
Leukocytes,Ua: NEGATIVE
Nitrite: NEGATIVE
Protein, ur: NEGATIVE mg/dL
Specific Gravity, Urine: 1.012 (ref 1.005–1.030)
pH: 6 (ref 5.0–8.0)

## 2018-06-06 LAB — PROTIME-INR
INR: 1 (ref 0.8–1.2)
Prothrombin Time: 12.9 seconds (ref 11.4–15.2)

## 2018-06-06 NOTE — Pre-Procedure Instructions (Signed)
PCR results 05/22/2018 in epic.  CMP results 06/06/2018 sent to Dr. Darrelyn Hillock via epic.

## 2018-06-10 MED ORDER — BUPIVACAINE LIPOSOME 1.3 % IJ SUSP
20.0000 mL | Freq: Once | INTRAMUSCULAR | Status: DC
  Filled 2018-06-10: qty 20

## 2018-06-11 ENCOUNTER — Inpatient Hospital Stay (HOSPITAL_COMMUNITY): Admitting: Certified Registered Nurse Anesthetist

## 2018-06-11 ENCOUNTER — Inpatient Hospital Stay (HOSPITAL_COMMUNITY): Admitting: Physician Assistant

## 2018-06-11 ENCOUNTER — Inpatient Hospital Stay (HOSPITAL_COMMUNITY)
Admission: RE | Admit: 2018-06-11 | Discharge: 2018-06-13 | DRG: 470 | Disposition: A | Discharge status: Home / Self Care | Attending: Orthopedic Surgery | Admitting: Orthopedic Surgery

## 2018-06-11 ENCOUNTER — Other Ambulatory Visit: Payer: Self-pay

## 2018-06-11 ENCOUNTER — Encounter (HOSPITAL_COMMUNITY)
Admission: RE | Disposition: A | Discharge status: Home / Self Care | Payer: Self-pay | Source: Home / Self Care | Attending: Orthopedic Surgery

## 2018-06-11 ENCOUNTER — Encounter (HOSPITAL_COMMUNITY): Payer: Self-pay | Admitting: *Deleted

## 2018-06-11 DIAGNOSIS — Z9049 Acquired absence of other specified parts of digestive tract: Secondary | ICD-10-CM | POA: Diagnosis not present

## 2018-06-11 DIAGNOSIS — I10 Essential (primary) hypertension: Secondary | ICD-10-CM | POA: Diagnosis present

## 2018-06-11 DIAGNOSIS — Z8051 Family history of malignant neoplasm of kidney: Secondary | ICD-10-CM

## 2018-06-11 DIAGNOSIS — K219 Gastro-esophageal reflux disease without esophagitis: Secondary | ICD-10-CM | POA: Diagnosis present

## 2018-06-11 DIAGNOSIS — M1712 Unilateral primary osteoarthritis, left knee: Principal | ICD-10-CM | POA: Diagnosis present

## 2018-06-11 DIAGNOSIS — Z90710 Acquired absence of both cervix and uterus: Secondary | ICD-10-CM

## 2018-06-11 DIAGNOSIS — Z8042 Family history of malignant neoplasm of prostate: Secondary | ICD-10-CM

## 2018-06-11 DIAGNOSIS — G43909 Migraine, unspecified, not intractable, without status migrainosus: Secondary | ICD-10-CM | POA: Diagnosis present

## 2018-06-11 DIAGNOSIS — Z79899 Other long term (current) drug therapy: Secondary | ICD-10-CM

## 2018-06-11 DIAGNOSIS — E039 Hypothyroidism, unspecified: Secondary | ICD-10-CM | POA: Diagnosis present

## 2018-06-11 DIAGNOSIS — F329 Major depressive disorder, single episode, unspecified: Secondary | ICD-10-CM | POA: Diagnosis present

## 2018-06-11 DIAGNOSIS — Z96652 Presence of left artificial knee joint: Secondary | ICD-10-CM

## 2018-06-11 DIAGNOSIS — R609 Edema, unspecified: Secondary | ICD-10-CM | POA: Diagnosis not present

## 2018-06-11 DIAGNOSIS — Z79890 Hormone replacement therapy (postmenopausal): Secondary | ICD-10-CM

## 2018-06-11 DIAGNOSIS — F419 Anxiety disorder, unspecified: Secondary | ICD-10-CM | POA: Diagnosis present

## 2018-06-11 DIAGNOSIS — Z87891 Personal history of nicotine dependence: Secondary | ICD-10-CM | POA: Diagnosis not present

## 2018-06-11 DIAGNOSIS — Z8249 Family history of ischemic heart disease and other diseases of the circulatory system: Secondary | ICD-10-CM | POA: Diagnosis not present

## 2018-06-11 DIAGNOSIS — M7989 Other specified soft tissue disorders: Secondary | ICD-10-CM | POA: Diagnosis not present

## 2018-06-11 HISTORY — PX: TOTAL KNEE ARTHROPLASTY: SHX125

## 2018-06-11 LAB — TYPE AND SCREEN
ABO/RH(D): A POS
Antibody Screen: NEGATIVE

## 2018-06-11 SURGERY — ARTHROPLASTY, KNEE, TOTAL
Anesthesia: Spinal | Laterality: Left

## 2018-06-11 MED ORDER — LACTATED RINGERS IV SOLN
INTRAVENOUS | Status: DC
  Administered 2018-06-11 (×2): via INTRAVENOUS

## 2018-06-11 MED ORDER — BUPIVACAINE LIPOSOME 1.3 % IJ SUSP
INTRAMUSCULAR | Status: DC | PRN
  Administered 2018-06-11: 20 mL

## 2018-06-11 MED ORDER — CHLORHEXIDINE GLUCONATE 4 % EX LIQD: 60.0000 mL | Freq: Once | CUTANEOUS | Status: DC

## 2018-06-11 MED ORDER — TRANEXAMIC ACID-NACL 1000-0.7 MG/100ML-% IV SOLN
1000.0000 mg | INTRAVENOUS | Status: AC
  Administered 2018-06-11: 1000 mg via INTRAVENOUS
  Filled 2018-06-11: qty 100

## 2018-06-11 MED ORDER — SODIUM CHLORIDE (PF) 0.9 % IJ SOLN
INTRAMUSCULAR | Status: DC | PRN
  Administered 2018-06-11: 20 mL

## 2018-06-11 MED ORDER — PSEUDOEPHEDRINE HCL ER 120 MG PO TB12
120.0000 mg | ORAL_TABLET | Freq: Two times a day (BID) | ORAL | Status: DC | PRN
  Filled 2018-06-11: qty 1

## 2018-06-11 MED ORDER — FENTANYL CITRATE (PF) 100 MCG/2ML IJ SOLN
50.0000 ug | INTRAMUSCULAR | Status: DC
  Administered 2018-06-11: 100 ug via INTRAVENOUS
  Filled 2018-06-11: qty 2

## 2018-06-11 MED ORDER — CEFAZOLIN SODIUM-DEXTROSE 1-4 GM/50ML-% IV SOLN
1.0000 g | Freq: Four times a day (QID) | INTRAVENOUS | Status: AC
  Administered 2018-06-11 – 2018-06-12 (×2): 1 g via INTRAVENOUS
  Filled 2018-06-11 (×2): qty 50

## 2018-06-11 MED ORDER — ONDANSETRON HCL 4 MG/2ML IJ SOLN
INTRAMUSCULAR | Status: DC | PRN
  Administered 2018-06-11: 4 mg via INTRAVENOUS

## 2018-06-11 MED ORDER — POLYETHYLENE GLYCOL 3350 17 G PO PACK: 17.0000 g | PACK | Freq: Every day | ORAL | Status: DC | PRN

## 2018-06-11 MED ORDER — HYDROCHLOROTHIAZIDE 25 MG PO TABS
25.0000 mg | ORAL_TABLET | Freq: Every day | ORAL | Status: DC
  Administered 2018-06-12 – 2018-06-13 (×2): 25 mg via ORAL
  Filled 2018-06-11 (×2): qty 1

## 2018-06-11 MED ORDER — ACETAMINOPHEN 325 MG PO TABS: 325.0000 mg | ORAL_TABLET | Freq: Four times a day (QID) | ORAL | Status: DC | PRN

## 2018-06-11 MED ORDER — ACETAMINOPHEN 500 MG PO TABS
1000.0000 mg | ORAL_TABLET | Freq: Four times a day (QID) | ORAL | Status: AC
  Administered 2018-06-11 – 2018-06-12 (×3): 1000 mg via ORAL
  Filled 2018-06-11 (×4): qty 2

## 2018-06-11 MED ORDER — OXYCODONE HCL 5 MG PO TABS
10.0000 mg | ORAL_TABLET | ORAL | Status: DC | PRN
  Administered 2018-06-11 – 2018-06-12 (×2): 10 mg via ORAL
  Administered 2018-06-12 – 2018-06-13 (×5): 15 mg via ORAL
  Filled 2018-06-11: qty 3
  Filled 2018-06-11: qty 2
  Filled 2018-06-11: qty 3
  Filled 2018-06-11: qty 2
  Filled 2018-06-11 (×3): qty 3

## 2018-06-11 MED ORDER — PHENOL 1.4 % MT LIQD
1.0000 | OROMUCOSAL | Status: DC | PRN
  Filled 2018-06-11: qty 177

## 2018-06-11 MED ORDER — SODIUM CHLORIDE 0.9 % IR SOLN
Status: DC | PRN
  Administered 2018-06-11: 1000 mL

## 2018-06-11 MED ORDER — MIDAZOLAM HCL 2 MG/2ML IJ SOLN
1.0000 mg | INTRAMUSCULAR | Status: DC
  Administered 2018-06-11: 2 mg via INTRAVENOUS
  Filled 2018-06-11: qty 2

## 2018-06-11 MED ORDER — RIVAROXABAN 10 MG PO TABS
10.0000 mg | ORAL_TABLET | Freq: Every day | ORAL | Status: DC
  Administered 2018-06-12 – 2018-06-13 (×2): 10 mg via ORAL
  Filled 2018-06-11 (×2): qty 1

## 2018-06-11 MED ORDER — ESTROGENS CONJUGATED 0.3 MG PO TABS
0.9000 mg | ORAL_TABLET | Freq: Every day | ORAL | Status: DC
  Administered 2018-06-12 – 2018-06-13 (×2): 0.9 mg via ORAL
  Filled 2018-06-11: qty 3
  Filled 2018-06-11: qty 2
  Filled 2018-06-11: qty 3
  Filled 2018-06-11: qty 2

## 2018-06-11 MED ORDER — MIDAZOLAM HCL 2 MG/2ML IJ SOLN
INTRAMUSCULAR | Status: AC
  Filled 2018-06-11: qty 2

## 2018-06-11 MED ORDER — METHOCARBAMOL 500 MG IVPB - SIMPLE MED
500.0000 mg | Freq: Four times a day (QID) | INTRAVENOUS | Status: DC | PRN
  Filled 2018-06-11: qty 50

## 2018-06-11 MED ORDER — PROPOFOL 10 MG/ML IV BOLUS
INTRAVENOUS | Status: AC
  Filled 2018-06-11: qty 60

## 2018-06-11 MED ORDER — SUMATRIPTAN SUCCINATE 50 MG PO TABS
50.0000 mg | ORAL_TABLET | Freq: Once | ORAL | Status: DC | PRN
  Filled 2018-06-11: qty 1

## 2018-06-11 MED ORDER — LEVOTHYROXINE SODIUM 25 MCG PO TABS
25.0000 ug | ORAL_TABLET | Freq: Every day | ORAL | Status: DC
  Administered 2018-06-12 – 2018-06-13 (×2): 25 ug via ORAL
  Filled 2018-06-11 (×2): qty 1

## 2018-06-11 MED ORDER — MENTHOL 3 MG MT LOZG
1.0000 | LOZENGE | OROMUCOSAL | Status: DC | PRN
  Filled 2018-06-11: qty 9

## 2018-06-11 MED ORDER — HYDROMORPHONE HCL 1 MG/ML IJ SOLN
0.5000 mg | INTRAMUSCULAR | Status: DC | PRN
  Administered 2018-06-11: 1 mg via INTRAVENOUS
  Filled 2018-06-11: qty 1

## 2018-06-11 MED ORDER — SODIUM CHLORIDE 0.9 % IV SOLN
INTRAVENOUS | Status: DC | PRN
  Administered 2018-06-11: 500 mL

## 2018-06-11 MED ORDER — LACTATED RINGERS IV SOLN
INTRAVENOUS | Status: DC
  Administered 2018-06-11: 16:00:00 via INTRAVENOUS

## 2018-06-11 MED ORDER — OLMESARTAN MEDOXOMIL-HCTZ 40-25 MG PO TABS: 1.0000 | ORAL_TABLET | Freq: Every day | ORAL | Status: DC

## 2018-06-11 MED ORDER — PHENYLEPHRINE HCL 10 MG/ML IJ SOLN
INTRAMUSCULAR | Status: AC
  Filled 2018-06-11: qty 1

## 2018-06-11 MED ORDER — BUPIVACAINE HCL (PF) 0.25 % IJ SOLN
INTRAMUSCULAR | Status: AC
  Filled 2018-06-11: qty 30

## 2018-06-11 MED ORDER — OXYCODONE HCL 5 MG PO TABS
5.0000 mg | ORAL_TABLET | ORAL | Status: DC | PRN
  Administered 2018-06-11 – 2018-06-13 (×2): 10 mg via ORAL
  Filled 2018-06-11 (×2): qty 2

## 2018-06-11 MED ORDER — ACETAMINOPHEN 500 MG PO TABS
1000.0000 mg | ORAL_TABLET | Freq: Once | ORAL | Status: AC
  Administered 2018-06-11: 1000 mg via ORAL
  Filled 2018-06-11: qty 2

## 2018-06-11 MED ORDER — PROPOFOL 10 MG/ML IV BOLUS
INTRAVENOUS | Status: DC | PRN
  Administered 2018-06-11 (×3): 10 mg via INTRAVENOUS
  Administered 2018-06-11 (×2): 20 mg via INTRAVENOUS

## 2018-06-11 MED ORDER — PROPOFOL 10 MG/ML IV BOLUS
INTRAVENOUS | Status: AC
  Filled 2018-06-11: qty 20

## 2018-06-11 MED ORDER — OLMESARTAN MEDOXOMIL 20 MG PO TABS
40.0000 mg | ORAL_TABLET | Freq: Every day | ORAL | Status: DC
  Administered 2018-06-12 – 2018-06-13 (×2): 40 mg via ORAL
  Filled 2018-06-11 (×2): qty 2

## 2018-06-11 MED ORDER — METOCLOPRAMIDE HCL 5 MG/ML IJ SOLN
5.0000 mg | Freq: Three times a day (TID) | INTRAMUSCULAR | Status: DC | PRN
  Administered 2018-06-12: 5 mg via INTRAVENOUS
  Filled 2018-06-11: qty 2

## 2018-06-11 MED ORDER — CEFAZOLIN SODIUM-DEXTROSE 2-4 GM/100ML-% IV SOLN
2.0000 g | INTRAVENOUS | Status: AC
  Administered 2018-06-11: 2 g via INTRAVENOUS
  Filled 2018-06-11: qty 100

## 2018-06-11 MED ORDER — ONDANSETRON HCL 4 MG/2ML IJ SOLN
4.0000 mg | Freq: Four times a day (QID) | INTRAMUSCULAR | Status: DC | PRN
  Administered 2018-06-11 – 2018-06-13 (×5): 4 mg via INTRAVENOUS
  Filled 2018-06-11 (×5): qty 2

## 2018-06-11 MED ORDER — ONDANSETRON HCL 4 MG/2ML IJ SOLN
INTRAMUSCULAR | Status: AC
  Filled 2018-06-11: qty 2

## 2018-06-11 MED ORDER — SODIUM CHLORIDE 0.9 % IV SOLN
INTRAVENOUS | Status: AC
  Filled 2018-06-11: qty 500000

## 2018-06-11 MED ORDER — SODIUM CHLORIDE 0.9 % IV BOLUS
250.0000 mL | Freq: Once | INTRAVENOUS | Status: AC
  Administered 2018-06-11: 250 mL via INTRAVENOUS

## 2018-06-11 MED ORDER — FLEET ENEMA 7-19 GM/118ML RE ENEM: 1.0000 | ENEMA | Freq: Once | RECTAL | Status: DC | PRN

## 2018-06-11 MED ORDER — FERROUS SULFATE 325 (65 FE) MG PO TABS
325.0000 mg | ORAL_TABLET | Freq: Three times a day (TID) | ORAL | Status: DC
  Administered 2018-06-12 – 2018-06-13 (×4): 325 mg via ORAL
  Filled 2018-06-11 (×4): qty 1

## 2018-06-11 MED ORDER — METOCLOPRAMIDE HCL 5 MG PO TABS: 5.0000 mg | ORAL_TABLET | Freq: Three times a day (TID) | ORAL | Status: DC | PRN

## 2018-06-11 MED ORDER — ONDANSETRON HCL 4 MG PO TABS: 4.0000 mg | ORAL_TABLET | Freq: Four times a day (QID) | ORAL | Status: DC | PRN

## 2018-06-11 MED ORDER — BUPIVACAINE IN DEXTROSE 0.75-8.25 % IT SOLN
INTRATHECAL | Status: DC | PRN
  Administered 2018-06-11: 1.8 mL via INTRATHECAL

## 2018-06-11 MED ORDER — DOCUSATE SODIUM 100 MG PO CAPS
100.0000 mg | ORAL_CAPSULE | Freq: Two times a day (BID) | ORAL | Status: DC
  Administered 2018-06-11 – 2018-06-13 (×4): 100 mg via ORAL
  Filled 2018-06-11 (×4): qty 1

## 2018-06-11 MED ORDER — HYDROMORPHONE HCL 1 MG/ML IJ SOLN
0.5000 mg | INTRAMUSCULAR | Status: DC | PRN
  Administered 2018-06-11 (×2): 1 mg via INTRAVENOUS
  Filled 2018-06-11 (×2): qty 1

## 2018-06-11 MED ORDER — FENTANYL CITRATE (PF) 100 MCG/2ML IJ SOLN: 25.0000 ug | INTRAMUSCULAR | Status: DC | PRN

## 2018-06-11 MED ORDER — PHENYLEPHRINE HCL 10 MG/ML IJ SOLN
INTRAVENOUS | Status: DC | PRN
  Administered 2018-06-11: 25 ug/min via INTRAVENOUS

## 2018-06-11 MED ORDER — PANTOPRAZOLE SODIUM 40 MG PO TBEC
40.0000 mg | DELAYED_RELEASE_TABLET | Freq: Every day | ORAL | Status: DC
  Administered 2018-06-12 – 2018-06-13 (×2): 40 mg via ORAL
  Filled 2018-06-11 (×2): qty 1

## 2018-06-11 MED ORDER — SODIUM CHLORIDE (PF) 0.9 % IJ SOLN
INTRAMUSCULAR | Status: AC
  Filled 2018-06-11: qty 20

## 2018-06-11 MED ORDER — MIDAZOLAM HCL 5 MG/5ML IJ SOLN
INTRAMUSCULAR | Status: DC | PRN
  Administered 2018-06-11: 1 mg via INTRAVENOUS

## 2018-06-11 MED ORDER — PROPOFOL 10 MG/ML IV BOLUS
INTRAVENOUS | Status: AC
  Filled 2018-06-11: qty 40

## 2018-06-11 MED ORDER — BISACODYL 5 MG PO TBEC: 5.0000 mg | DELAYED_RELEASE_TABLET | Freq: Every day | ORAL | Status: DC | PRN

## 2018-06-11 MED ORDER — METHOCARBAMOL 500 MG PO TABS
500.0000 mg | ORAL_TABLET | Freq: Four times a day (QID) | ORAL | Status: DC | PRN
  Administered 2018-06-11 – 2018-06-13 (×7): 500 mg via ORAL
  Filled 2018-06-11 (×7): qty 1

## 2018-06-11 MED ORDER — BUPIVACAINE-EPINEPHRINE (PF) 0.5% -1:200000 IJ SOLN
INTRAMUSCULAR | Status: DC | PRN
  Administered 2018-06-11: 20 mL via PERINEURAL

## 2018-06-11 MED ORDER — ALPRAZOLAM 1 MG PO TABS
1.0000 mg | ORAL_TABLET | Freq: Every day | ORAL | Status: DC
  Administered 2018-06-11 – 2018-06-12 (×2): 1 mg via ORAL
  Filled 2018-06-11 (×2): qty 1

## 2018-06-11 MED ORDER — ALUM & MAG HYDROXIDE-SIMETH 200-200-20 MG/5ML PO SUSP: 30.0000 mL | ORAL | Status: DC | PRN

## 2018-06-11 MED ORDER — PROPOFOL 500 MG/50ML IV EMUL
INTRAVENOUS | Status: DC | PRN
  Administered 2018-06-11: 75 ug/kg/min via INTRAVENOUS

## 2018-06-11 SURGICAL SUPPLY — 76 items
AGENT HMST SPONGE THK3/8 (HEMOSTASIS) ×1
APL PRP STRL LF DISP 70% ISPRP (MISCELLANEOUS) ×1
BAG DECANTER FOR FLEXI CONT (MISCELLANEOUS) ×3 IMPLANT
BAG SPEC THK2 15X12 ZIP CLS (MISCELLANEOUS)
BAG ZIPLOCK 12X15 (MISCELLANEOUS) IMPLANT
BANDAGE ACE 4X5 VEL STRL LF (GAUZE/BANDAGES/DRESSINGS) ×3 IMPLANT
BANDAGE ACE 6X5 VEL STRL LF (GAUZE/BANDAGES/DRESSINGS) ×3 IMPLANT
BLADE SAG 18X100X1.27 (BLADE) ×3 IMPLANT
BLADE SAW SGTL 11.0X1.19X90.0M (BLADE) ×3 IMPLANT
BLADE SURG SZ10 CARB STEEL (BLADE) ×6 IMPLANT
BNDG GAUZE ELAST 4 BULKY (GAUZE/BANDAGES/DRESSINGS) ×4 IMPLANT
CEMENT HV SMART SET (Cement) ×6 IMPLANT
CEMENT TIBIA MBT SIZE 2.5 (Knees) IMPLANT
CHLORAPREP W/TINT 26 (MISCELLANEOUS) ×3 IMPLANT
CLOSURE STERI-STRIP 1/2X4 (GAUZE/BANDAGES/DRESSINGS) ×1
CLOSURE WOUND 1/2 X4 (GAUZE/BANDAGES/DRESSINGS) ×2
CLSR STERI-STRIP ANTIMIC 1/2X4 (GAUZE/BANDAGES/DRESSINGS) ×1 IMPLANT
COVER SURGICAL LIGHT HANDLE (MISCELLANEOUS) ×3 IMPLANT
COVER WAND RF STERILE (DRAPES) IMPLANT
CUFF TOURN SGL QUICK 34 (TOURNIQUET CUFF) ×3
CUFF TRNQT CYL 34X4.125X (TOURNIQUET CUFF) ×1 IMPLANT
DECANTER SPIKE VIAL GLASS SM (MISCELLANEOUS) ×3 IMPLANT
DRAPE INCISE IOBAN 66X45 STRL (DRAPES) ×2 IMPLANT
DRAPE U-SHAPE 47X51 STRL (DRAPES) ×3 IMPLANT
DRSG ADAPTIC 3X8 NADH LF (GAUZE/BANDAGES/DRESSINGS) ×3 IMPLANT
DRSG EMULSION OIL 3X16 NADH (GAUZE/BANDAGES/DRESSINGS) ×2 IMPLANT
DRSG PAD ABDOMINAL 8X10 ST (GAUZE/BANDAGES/DRESSINGS) ×6 IMPLANT
ELECT BLADE TIP CTD 4 INCH (ELECTRODE) ×3 IMPLANT
ELECT REM PT RETURN 15FT ADLT (MISCELLANEOUS) ×3 IMPLANT
EVACUATOR 1/8 PVC DRAIN (DRAIN) ×3 IMPLANT
FACESHIELD WRAPAROUND (MASK) ×9 IMPLANT
FACESHIELD WRAPAROUND OR TEAM (MASK) ×1 IMPLANT
GAUZE SPONGE 4X4 12PLY STRL (GAUZE/BANDAGES/DRESSINGS) ×3 IMPLANT
GLOVE BIOGEL PI IND STRL 6.5 (GLOVE) ×1 IMPLANT
GLOVE BIOGEL PI IND STRL 8.5 (GLOVE) ×1 IMPLANT
GLOVE BIOGEL PI INDICATOR 6.5 (GLOVE) ×2
GLOVE BIOGEL PI INDICATOR 8.5 (GLOVE) ×2
GLOVE ECLIPSE 8.0 STRL XLNG CF (GLOVE) ×6 IMPLANT
GLOVE SURG SS PI 6.5 STRL IVOR (GLOVE) ×3 IMPLANT
GOWN STRL REUS W/TWL LRG LVL3 (GOWN DISPOSABLE) ×7 IMPLANT
GOWN STRL REUS W/TWL XL LVL3 (GOWN DISPOSABLE) ×5 IMPLANT
HANDPIECE INTERPULSE COAX TIP (DISPOSABLE) ×3
HEMOSTAT SPONGE AVITENE ULTRA (HEMOSTASIS) ×3 IMPLANT
HOLDER FOLEY CATH W/STRAP (MISCELLANEOUS) IMPLANT
IMMOBILIZER KNEE 20 (SOFTGOODS) ×5 IMPLANT
IMMOBILIZER KNEE 20 THIGH 36 (SOFTGOODS) ×1 IMPLANT
IMPL FEMUR SIGMA LT PS SZ 3 (Knees) IMPLANT
IMPLANT FEMUR SIGMA LT PS SZ 3 (Knees) ×3 IMPLANT
INSERT PFC SIG STB SZ3 15.0MM (Knees) ×2 IMPLANT
MANIFOLD NEPTUNE II (INSTRUMENTS) ×3 IMPLANT
NS IRRIG 1000ML POUR BTL (IV SOLUTION) IMPLANT
PACK TOTAL KNEE CUSTOM (KITS) ×3 IMPLANT
PADDING CAST COTTON 6X4 STRL (CAST SUPPLIES) ×3 IMPLANT
PATELLA DOME PFC 38MM (Knees) ×2 IMPLANT
PENCIL SMOKE EVAC W/HOLSTER (ELECTROSURGICAL) ×1 IMPLANT
PIN STEINMAN FIXATION KNEE (PIN) ×2 IMPLANT
PROTECTOR NERVE ULNAR (MISCELLANEOUS) ×3 IMPLANT
SET HNDPC FAN SPRY TIP SCT (DISPOSABLE) ×1 IMPLANT
SET PAD KNEE POSITIONER (MISCELLANEOUS) ×3 IMPLANT
SPONGE LAP 18X18 RF (DISPOSABLE) IMPLANT
STRIP CLOSURE SKIN 1/2X4 (GAUZE/BANDAGES/DRESSINGS) ×4 IMPLANT
SUT BONE WAX W31G (SUTURE) IMPLANT
SUT MNCRL AB 4-0 PS2 18 (SUTURE) ×3 IMPLANT
SUT STRATAFIX 0 PDS 27 VIOLET (SUTURE) ×3
SUT VIC AB 1 CT1 27 (SUTURE) ×3
SUT VIC AB 1 CT1 27XBRD ANTBC (SUTURE) ×1 IMPLANT
SUT VIC AB 2-0 CT1 27 (SUTURE) ×9
SUT VIC AB 2-0 CT1 TAPERPNT 27 (SUTURE) ×3 IMPLANT
SUTURE STRATFX 0 PDS 27 VIOLET (SUTURE) ×1 IMPLANT
SYR 20CC LL (SYRINGE) ×6 IMPLANT
TIBIA MBT CEMENT SIZE 2.5 (Knees) ×3 IMPLANT
TOWER CARTRIDGE SMART MIX (DISPOSABLE) ×3 IMPLANT
TRAY FOLEY CATH 14FR (SET/KITS/TRAYS/PACK) ×2 IMPLANT
WATER STERILE IRR 1000ML POUR (IV SOLUTION) ×3 IMPLANT
WRAP KNEE MAXI GEL POST OP (GAUZE/BANDAGES/DRESSINGS) ×3 IMPLANT
YANKAUER SUCT BULB TIP 10FT TU (MISCELLANEOUS) ×3 IMPLANT

## 2018-06-11 NOTE — Anesthesia Preprocedure Evaluation (Addendum)
Anesthesia Evaluation  Patient identified by MRN, date of birth, ID band Patient awake    Reviewed: Allergy & Precautions, NPO status , Patient's Chart, lab work & pertinent test results  History of Anesthesia Complications (+) PONV and history of anesthetic complications  Airway Mallampati: III  TM Distance: >3 FB Neck ROM: Full    Dental no notable dental hx. (+) Teeth Intact, Dental Advisory Given   Pulmonary neg pulmonary ROS, former smoker,    Pulmonary exam normal breath sounds clear to auscultation       Cardiovascular hypertension, negative cardio ROS Normal cardiovascular exam Rhythm:Regular Rate:Normal     Neuro/Psych PSYCHIATRIC DISORDERS Anxiety Depression negative neurological ROS     GI/Hepatic Neg liver ROS, GERD  ,  Endo/Other  Hypothyroidism   Renal/GU negative Renal ROS  negative genitourinary   Musculoskeletal  (+) Arthritis ,   Abdominal   Peds  Hematology negative hematology ROS (+)   Anesthesia Other Findings   Reproductive/Obstetrics negative OB ROS                           Anesthesia Physical Anesthesia Plan  ASA: II  Anesthesia Plan: Spinal   Post-op Pain Management:    Induction:   PONV Risk Score and Plan: 3 and Treatment may vary due to age or medical condition, Ondansetron, Dexamethasone, Propofol infusion and Midazolam  Airway Management Planned: Natural Airway and Simple Face Mask  Additional Equipment:   Intra-op Plan:   Post-operative Plan:   Informed Consent: I have reviewed the patients History and Physical, chart, labs and discussed the procedure including the risks, benefits and alternatives for the proposed anesthesia with the patient or authorized representative who has indicated his/her understanding and acceptance.     Dental advisory given  Plan Discussed with: CRNA  Anesthesia Plan Comments:         Anesthesia Quick  Evaluation

## 2018-06-11 NOTE — Op Note (Signed)
NAMEHEATHER, LIKES MEDICAL RECORD IO:[id number] ACCOUNT [id number] DATE OF BIRTH:1955/12/11 FACILITY: [facility] LOCATION: [facility] PHYSICIAN:Sherry Rogus Sanjuana Kava, MD  OPERATIVE REPORT  DATE OF PROCEDURE:  06/11/2018  SURGEON:  [name], MD  ASSISTANT:  Dimitri Ped, PA  PREOPERATIVE DIAGNOSES: 1.  Severe primary osteoarthritis with bone on bone. 2.  Flexion contracture of left knee.  POSTOPERATIVE DIAGNOSES: 1.  Severe primary osteoarthritis with bone on bone. 2.  Flexion contracture of left knee.  PROCEDURE:  Left total knee arthroplasty utilizing the DePuy system.  All 3 components were cemented.  The sizes used were a size 3 femoral component, left posterior cruciate sacrificing type.  The patella component was a size 38.  The tibial tray was a  size 2.5.  The insert was a size 3, 15 mm thickness.  DESCRIPTION OF PROCEDURE:  Under spinal anesthesia and with a left femoral nerve block, a routine orthopedic prep and draping of the left lower extremity was carried out.  The appropriate time-out was first carried out.  I also marked the appropriate  left leg in the holding area.  At this time, the leg was exsanguinated with an Esmarch, tourniquet was elevated 325 mm.  The left leg then was placed in the [facility] knee holder.  An anterior approach to the knee was carried out.  Two flaps were created.   Following this, I carried out a median parapatellar approach, reflected the patella laterally, flexed the knee, and did medial and lateral meniscectomies.  I also excised the anterior and posterior cruciate ligaments.  Following that, a synovectomy was  carried out.  An initial drill hole was made in the intercondylar notch.  A guide rod was inserted up the canal.  At this time, 12 mm thickness was taken off the distal femur utilizing the distal femoral guide.  Following that, the femur was measured to  be a size 3 left.  We then carried out anterior, posterior, and chamfering cuts with the  appropriate jig in place.  Then, attention was directed to the tibial plateau.  ____ were placed into the posterior tibia notch region, as well as over the lateral  patellar region.  I then removed the spinous processes with the oscillating saw.  I then did a primary measurement of the tibial plateau to be a size 2.5.  The external guide was used.  I removed 4 mm thickness off the affected side and across the tibial  plateau.  I then inserted the lamina spreaders.  I removed posterior spurs off the femoral condyle and completed the meniscectomies.  Following that, we then inserted the lamina spreaders I mentioned.  We measured the gap space with the appropriate  spacer blocks.  I then went on and completed the cuts in the tibial plateau, the keel cuts followed by the femoral notch cut.  At this particular time, the trial components were inserted.  We had an excellent fit with the 15 mm thickness insert.  I then  did a resurfacing procedure on the patella.  Three drill holes were made in the patella at this time on the articular surface.  We then made the appropriate measures prior to that for a 38 mm patella.  All components then were removed.  The knee was  waterpik'd thoroughly, and then I cemented all 3 components in simultaneously.  The cement was hardened, and all the remaining loose pieces of cement were removed.  I waterpik'd the knee out again.  We then tested the knee with  the insert in.  We had  good stability as far as medial, lateral and anterior, posterior stability with the 15 mm thickness insert, so the permanent rotating platform insert size 3 was inserted.  The knee was reduced, again taken through motion.  We had excellent function.  I  then inserted my Hemovac drain, closed the knee in layers in the usual fashion after we irrigated again with antibiotic solution.  Note, we injected 20 mL of Exparel with normal saline.  This was done at the end before the skin was closed.  Sterile   Neosporin dressings were applied after the skin closure.  Note the patient had 2 g of IV Ancef preop.  The patient will be kept overnight.  LN/NUANCE  D:06/11/2018 T:06/11/2018 JOB:[id number]

## 2018-06-11 NOTE — Anesthesia Procedure Notes (Signed)
Procedure Name: MAC Date/Time: 06/11/2018 12:49 PM Performed by: Maxwell Caul, CRNA Pre-anesthesia Checklist: Patient identified, Emergency Drugs available, Suction available and Patient being monitored Oxygen Delivery Method: Simple face mask

## 2018-06-11 NOTE — Progress Notes (Signed)
Assisted Dr. woodrum with left, ultrasound guided, adductor canal block. Side rails up, monitors on throughout procedure. See vital signs in flow sheet. Tolerated Procedure well.  

## 2018-06-11 NOTE — Transfer of Care (Signed)
Immediate Anesthesia Transfer of Care Note  Patient: Kathryn Hicks  Procedure(s) Performed: TOTAL KNEE ARTHROPLASTY (Left )  Patient Location: PACU  Anesthesia Type:Spinal  Level of Consciousness: awake, alert , oriented and patient cooperative  Airway & Oxygen Therapy: Patient Spontanous Breathing and Patient connected to face mask oxygen  Post-op Assessment: Report given to RN, Post -op Vital signs reviewed and stable and Patient moving all extremities  Post vital signs: Reviewed and stable  Last Vitals:  Vitals Value Taken Time  BP    Temp    Pulse    Resp    SpO2      Last Pain:  Vitals:   06/11/18 1103  TempSrc:   PainSc: 1       Patients Stated Pain Goal: 3 (06/11/18 1103)  Complications: No apparent anesthesia complications

## 2018-06-11 NOTE — Brief Op Note (Signed)
06/11/2018  2:38 PM  PATIENT:  Kathryn Hicks  63 y.o. female  PRE-OPERATIVE DIAGNOSIS:  left knee Primary  Osteoarthritis with Flexion Contracture.  POST-OPERATIVE DIAGNOSIS:  Same as Pre-Op  PROCEDURE:  Procedure(s) with comments: TOTAL KNEE ARTHROPLASTY (Left) - and Release of Contractures.  SURGEON:  Surgeon(s) and Role:    * [name], MD - Primary  PHYSICIAN ASSISTANT: Dimitri Ped PA  ASSISTANTS:Amber [place] PA   ANESTHESIA:   spinal plus Femoral Nerve Block  EBL:  25 mL   BLOOD ADMINISTERED:none  DRAINS: (One) Hemovact drain(s) in the Left Total Knee with  Suction Open   LOCAL MEDICATIONS USED:  OTHER 20cc of Exparel mixed with 20cc of Normal Saline.  SPECIMEN:  No Specimen  DISPOSITION OF SPECIMEN:  N/A  COUNTS:  YES  TOURNIQUET:   Total Tourniquet Time Documented: Thigh (Left) - 73 minutes Total: Thigh (Left) - 73 minutes   DICTATION: .Other Dictation: Dictation Number  [id number]  PLAN OF CARE: Admit to inpatient   PATIENT DISPOSITION:  PACU - hemodynamically stable.   Delay start of Pharmacological VTE agent (>24hrs) due to surgical blood loss or risk of bleeding: yes

## 2018-06-11 NOTE — Interval H&P Note (Signed)
History and Physical Interval Note:  06/11/2018 12:32 PM  Kathryn Hicks  has presented today for surgery, with the diagnosis of left knee osteoarthritis.  The various methods of treatment have been discussed with the patient and family. After consideration of risks, benefits and other options for treatment, the patient has consented to  Procedure(s) with comments: TOTAL KNEE ARTHROPLASTY (Left) - as a surgical intervention.  The patient's history has been reviewed, patient examined, no change in status, stable for surgery.  I have reviewed the patient's chart and labs.  Questions were answered to the patient's satisfaction.     [name]

## 2018-06-11 NOTE — Anesthesia Procedure Notes (Signed)
Anesthesia Regional Block: Adductor canal block   Pre-Anesthetic Checklist: ,, timeout performed, Correct Patient, Correct Site, Correct Laterality, Correct Procedure, Correct Position, site marked, Risks and benefits discussed,  Surgical consent,  Pre-op evaluation,  At surgeon's request and post-op pain management  Laterality: Left  Prep: Maximum Sterile Barrier Precautions used, chloraprep       Needles:  Injection technique: Single-shot  Needle Type: Echogenic Stimulator Needle     Needle Length: 9cm  Needle Gauge: 22     Additional Needles:   Procedures:,,,, ultrasound used (permanent image in chart),,,,  Narrative:  Start time: 06/11/2018 12:02 PM End time: 06/11/2018 12:12 PM Injection made incrementally with aspirations every 5 mL.  Performed by: Personally  Anesthesiologist: Elmer Picker, MD  Additional Notes: Monitors applied. No increased pain on injection. No increased resistance to injection. Injection made in 5cc increments. Good needle visualization. Patient tolerated procedure well.

## 2018-06-11 NOTE — Anesthesia Procedure Notes (Signed)
Spinal  Patient location during procedure: OR Start time: 06/11/2018 12:50 PM End time: 06/11/2018 1:00 PM Staffing Anesthesiologist: Elmer Picker, MD Performed: anesthesiologist  Preanesthetic Checklist Completed: patient identified, surgical consent, pre-op evaluation, timeout performed, IV checked, risks and benefits discussed and monitors and equipment checked Spinal Block Patient position: sitting Prep: site prepped and draped and DuraPrep Patient monitoring: cardiac monitor, continuous pulse ox and blood pressure Approach: midline Location: L3-4 Injection technique: single-shot Needle Needle type: Pencan  Needle gauge: 24 G Needle length: 9 cm Assessment Sensory level: T6 Additional Notes Functioning IV was confirmed and monitors were applied. Sterile prep and drape, including hand hygiene and sterile gloves were used. The patient was positioned and the spine was prepped. The skin was anesthetized with lidocaine.  Free flow of clear CSF was obtained prior to injecting local anesthetic into the CSF.  The spinal needle aspirated freely following injection.  The needle was carefully withdrawn.  The patient tolerated the procedure well.

## 2018-06-11 NOTE — Anesthesia Postprocedure Evaluation (Signed)
Anesthesia Post Note  Patient: Kathryn Hicks  Procedure(s) Performed: TOTAL KNEE ARTHROPLASTY (Left )     Patient location during evaluation: PACU Anesthesia Type: Spinal and Regional Level of consciousness: oriented and awake and alert Pain management: pain level controlled Vital Signs Assessment: post-procedure vital signs reviewed and stable Respiratory status: spontaneous breathing, respiratory function stable and patient connected to nasal cannula oxygen Cardiovascular status: blood pressure returned to baseline and stable Postop Assessment: no headache, no backache and no apparent nausea or vomiting Anesthetic complications: no    Last Vitals:  Vitals:   06/11/18 1545 06/11/18 1600  BP: 119/74 140/78  Pulse: 65 68  Resp: 11 12  Temp:  36.6 C  SpO2: 100% 100%    Last Pain:  Vitals:   06/11/18 1600  TempSrc: Oral  PainSc: 0-No pain                 Keith Felten L Erek Kowal

## 2018-06-12 LAB — BASIC METABOLIC PANEL
Anion gap: 10 (ref 5–15)
BUN: 10 mg/dL (ref 8–23)
CO2: 26 mmol/L (ref 22–32)
Calcium: 8.8 mg/dL — ABNORMAL LOW (ref 8.9–10.3)
Chloride: 97 mmol/L — ABNORMAL LOW (ref 98–111)
Creatinine, Ser: 0.79 mg/dL (ref 0.44–1.00)
GFR calc Af Amer: >60 mL/min (ref >60)
GFR calc non Af Amer: >60 mL/min (ref >60)
Glucose, Bld: 131 mg/dL — ABNORMAL HIGH (ref 70–99)
Potassium: 3.9 mmol/L (ref 3.5–5.1)
Sodium: 133 mmol/L — ABNORMAL LOW (ref 135–145)

## 2018-06-12 LAB — CBC
HCT: 38.5 % (ref 36.0–46.0)
Hemoglobin: 12.5 g/dL (ref 12.0–15.0)
MCH: 32.2 pg (ref 26.0–34.0)
MCHC: 32.5 g/dL (ref 30.0–36.0)
MCV: 99.2 fL (ref 80.0–100.0)
Platelets: 147 10*3/uL — ABNORMAL LOW (ref 150–400)
RBC: 3.88 MIL/uL (ref 3.87–5.11)
RDW: 12.1 % (ref 11.5–15.5)
WBC: 7.2 10*3/uL (ref 4.0–10.5)
nRBC: 0 % (ref 0.0–0.2)

## 2018-06-12 MED ORDER — METHOCARBAMOL 500 MG PO TABS: 500.0000 mg | ORAL_TABLET | Freq: Four times a day (QID) | ORAL | 0 refills | Status: DC | PRN

## 2018-06-12 MED ORDER — LAMOTRIGINE 25 MG PO TABS
150.0000 mg | ORAL_TABLET | Freq: Two times a day (BID) | ORAL | Status: DC
  Administered 2018-06-12 – 2018-06-13 (×3): 150 mg via ORAL
  Filled 2018-06-12 (×3): qty 1

## 2018-06-12 MED ORDER — ONDANSETRON HCL 4 MG PO TABS: 4.0000 mg | ORAL_TABLET | Freq: Four times a day (QID) | ORAL | 0 refills | Status: DC | PRN

## 2018-06-12 MED ORDER — MORPHINE SULFATE (PF) 2 MG/ML IV SOLN
1.0000 mg | INTRAVENOUS | Status: DC | PRN
  Administered 2018-06-12 – 2018-06-13 (×5): 2 mg via INTRAVENOUS
  Filled 2018-06-12 (×5): qty 1

## 2018-06-12 MED ORDER — ASPIRIN EC 325 MG PO TBEC: 325.0000 mg | DELAYED_RELEASE_TABLET | Freq: Two times a day (BID) | ORAL | 0 refills | Status: DC

## 2018-06-12 NOTE — Discharge Instructions (Addendum)
° °Dr. Ronald Gioffre °[facility] °[address] °[place], [place] [address] °[phone] ° °TOTAL KNEE REPLACEMENT POSTOPERATIVE DIRECTIONS ° °Knee Rehabilitation, Guidelines Following Surgery  °Results after knee surgery are often greatly improved when you follow the exercise, range of motion and muscle strengthening exercises prescribed by your doctor. Safety measures are also important to protect the knee from further injury. Any time any of these exercises cause you to have increased pain or swelling in your knee joint, decrease the amount until you are comfortable again and slowly increase them. If you have problems or questions, call your caregiver or physical therapist for advice.  ° °HOME CARE INSTRUCTIONS  °Remove items at home which could result in a fall. This includes throw rugs or furniture in walking pathways.  °· ICE to the affected knee every three hours for 30 minutes at a time and then as needed for pain and swelling.  Continue to use ice on the knee for pain and swelling from surgery. You may notice swelling that will progress down to the foot and ankle.  This is normal after surgery.  Elevate the leg when you are not up walking on it.   °· Continue to use the breathing machine which will help keep your temperature down.  It is common for your temperature to cycle up and down following surgery, especially at night when you are not up moving around and exerting yourself.  The breathing machine keeps your lungs expanded and your temperature down. °· Do not place pillow under knee, focus on keeping the knee straight while resting ° °DIET °You may resume your previous home diet once your are discharged from the hospital. ° °DRESSING / WOUND CARE / SHOWERING °You may shower 3 days after surgery, but keep the wounds dry during showering.  You may use an occlusive plastic wrap (Press'n Seal for example), NO SOAKING/SUBMERGING IN THE BATHTUB.  If the bandage gets wet, change with a  clean dry gauze.  If the incision gets wet, pat the wound dry with a clean towel. °You may start showering once you are discharged home but do not submerge the incision under water. Just pat the incision dry and apply a dry gauze dressing on daily. °Change the surgical dressing daily and reapply a dry dressing each time. ° °ACTIVITY °Walk with your walker as instructed. °Use walker as long as suggested by your caregivers. °Avoid periods of inactivity such as sitting longer than an hour when not asleep. This helps prevent blood clots.  °You may resume a sexual relationship in one month or when given the OK by your doctor.  °You may return to work once you are cleared by your doctor.  °Do not drive a car for 6 weeks or until released by you surgeon.  °Do not drive while taking narcotics. ° °WEIGHT BEARING °Weight bearing as tolerated with assist device (walker, cane, etc) as directed, use it as long as suggested by your surgeon or therapist, typically at least 4-6 weeks. ° °POSTOPERATIVE CONSTIPATION PROTOCOL °Constipation - defined medically as fewer than three stools per week and severe constipation as less than one stool per week. ° °One of the most common issues patients have following surgery is constipation.  Even if you have a regular bowel pattern at home, your normal regimen is likely to be disrupted due to multiple reasons following surgery.  Combination of anesthesia, postoperative narcotics, change in appetite and fluid intake all can affect your bowels.  In order to   avoid complications following surgery, here are some recommendations in order to help you during your recovery period. ° °Colace (docusate) - Pick up an over-the-counter form of Colace or another stool softener and take twice a day as long as you are requiring postoperative pain medications.  Take with a full glass of water daily.  If you experience loose stools or diarrhea, hold the colace until you stool forms back up.  If your symptoms do  not get better within 1 week or if they get worse, check with your doctor. ° °Dulcolax (bisacodyl) - Pick up over-the-counter and take as directed by the product packaging as needed to assist with the movement of your bowels.  Take with a full glass of water.  Use this product as needed if not relieved by Colace only.  ° °MiraLax (polyethylene glycol) - Pick up over-the-counter to have on hand.  MiraLax is a solution that will increase the amount of water in your bowels to assist with bowel movements.  Take as directed and can mix with a glass of water, juice, soda, coffee, or tea.  Take if you go more than two days without a movement. °Do not use MiraLax more than once per day. Call your doctor if you are still constipated or irregular after using this medication for 7 days in a row. ° °If you continue to have problems with postoperative constipation, please contact the office for further assistance and recommendations.  If you experience "the worst abdominal pain ever" or develop nausea or vomiting, please contact the office immediatly for further recommendations for treatment. ° °ITCHING ° If you experience itching with your medications, try taking only a single pain pill, or even half a pain pill at a time.  You can also use Benadryl over the counter for itching or also to help with sleep.  ° °TED HOSE STOCKINGS °Wear the elastic stockings on both legs for three weeks following surgery during the day but you may remove then at night for sleeping. ° °MEDICATIONS °See your medication summary on the “After Visit Summary” that the nursing staff will review with you prior to discharge.  You may have some home medications which will be placed on hold until you complete the course of blood thinner medication.  It is important for you to complete the blood thinner medication as prescribed by your surgeon.  Continue your approved medications as instructed at time of discharge. ° °PRECAUTIONS °If you experience chest pain  or shortness of breath - call 911 immediately for transfer to the hospital emergency department.  °If you develop a fever greater that 101 F, purulent drainage from wound, increased redness or drainage from wound, foul odor from the wound/dressing, or calf pain - CONTACT YOUR SURGEON.   °                                                °FOLLOW-UP APPOINTMENTS °Make sure you keep all of your appointments after your operation with your surgeon and caregivers. You should call the office at the above phone number and make an appointment for approximately two weeks after the date of your surgery or on the date instructed by your surgeon outlined in the "After Visit Summary". ° ° °RANGE OF MOTION AND STRENGTHENING EXERCISES  °Rehabilitation of the knee is important following a knee injury or an operation. After   just a few days of immobilization, the muscles of the thigh which control the knee become weakened and shrink (atrophy). Knee exercises are designed to build up the tone and strength of the thigh muscles and to improve knee motion. Often times heat used for twenty to thirty minutes before working out will loosen up your tissues and help with improving the range of motion but do not use heat for the first two weeks following surgery. These exercises can be done on a training (exercise) mat, on the floor, on a table or on a bed. Use what ever works the best and is most comfortable for you Knee exercises include:  Leg Lifts - While your knee is still immobilized in a splint or cast, you can do straight leg raises. Lift the leg to 60 degrees, hold for 3 sec, and slowly lower the leg. Repeat 10-20 times 2-3 times daily. Perform this exercise against resistance later as your knee gets better.  Quad and Hamstring Sets - Tighten up the muscle on the front of the thigh (Quad) and hold for 5-10 sec. Repeat this 10-20 times hourly. Hamstring sets are done by pushing the foot backward against an object and holding for 5-10  sec. Repeat as with quad sets.   Leg Slides: Lying on your back, slowly slide your foot toward your buttocks, bending your knee up off the floor (only go as far as is comfortable). Then slowly slide your foot back down until your leg is flat on the floor again.  Angel Wings: Lying on your back spread your legs to the side as far apart as you can without causing discomfort.  A rehabilitation program following serious knee injuries can speed recovery and prevent re-injury in the future due to weakened muscles. Contact your doctor or a physical therapist for more information on knee rehabilitation.   IF YOU ARE TRANSFERRED TO A SKILLED REHAB FACILITY If the patient is transferred to a skilled rehab facility following release from the hospital, a list of the current medications will be sent to the facility for the patient to continue.  When discharged from the skilled rehab facility, please have the facility set up the patient's Home Health Physical Therapy prior to being released. Also, the skilled facility will be responsible for providing the patient with their medications at time of release from the facility to include their pain medication, the muscle relaxants, and their blood thinner medication. If the patient is still at the rehab facility at time of the two week follow up appointment, the skilled rehab facility will also need to assist the patient in arranging follow up appointment in our office and any transportation needs.  MAKE SURE YOU:  Understand these instructions.  Get help right away if you are not doing well or get worse.    Pick up stool softner and laxative for home use following surgery while on pain medications. Do not submerge incision under water. Please use good hand washing techniques while changing dressing each day. May shower starting three days after surgery. Please use a clean towel to pat the incision dry following showers. Continue to use ice for pain and swelling  after surgery. Do not use any lotions or creams on the incision until instructed by your surgeon.  Information on my medicine - XARELTO (Rivaroxaban)  This medication education was reviewed with me or my healthcare representative as part of my discharge preparation.  The pharmacist that spoke with me during my hospital stay was:  Chilton Si,  Aela Bohan L, RPH  Why was Xarelto prescribed for you? Xarelto was prescribed for you to reduce the risk of blood clots forming after orthopedic surgery. The medical term for these abnormal blood clots is venous thromboembolism (VTE).  What do you need to know about xarelto ? Take your Xarelto ONCE DAILY at the same time every day. You may take it either with or without food.  If you have difficulty swallowing the tablet whole, you may crush it and mix in applesauce just prior to taking your dose.  Take Xarelto exactly as prescribed by your doctor and DO NOT stop taking Xarelto without talking to the doctor who prescribed the medication.  Stopping without other VTE prevention medication to take the place of Xarelto may increase your risk of developing a clot.  After discharge, you should have regular check-up appointments with your healthcare provider that is prescribing your Xarelto.    What do you do if you miss a dose? If you miss a dose, take it as soon as you remember on the same day then continue your regularly scheduled once daily regimen the next day. Do not take two doses of Xarelto on the same day.   Important Safety Information A possible side effect of Xarelto is bleeding. You should call your healthcare provider right away if you experience any of the following: ? Bleeding from an injury or your nose that does not stop. ? Unusual colored urine (red or dark brown) or unusual colored stools (red or black). ? Unusual bruising for unknown reasons. ? A serious fall or if you hit your head (even if there is no bleeding).  Some medicines may  interact with Xarelto and might increase your risk of bleeding while on Xarelto. To help avoid this, consult your healthcare provider or pharmacist prior to using any new prescription or non-prescription medications, including herbals, vitamins, non-steroidal anti-inflammatory drugs (NSAIDs) and supplements.  This website has more information on Xarelto: VisitDestination.com.br.

## 2018-06-12 NOTE — Evaluation (Signed)
Physical Therapy Evaluation Patient Details Name: Kathryn Hicks MRN: [id number] DOB: Dec 31, 1955 Today's Date: 06/12/2018   History of Present Illness  Pt is a 63 year old female s/p L TKA  Clinical Impression  Pt is s/p TKA resulting in the deficits listed below (see PT Problem List).  Pt will benefit from skilled PT to increase their independence and safety with mobility to allow discharge to the venue listed below.  Pt only able to tolerate ambulating within her room due to increased pain.  Pt had already received pain meds, RN notified.  Pt plans to d/c home with spouse and has some steps to enter home.    Follow Up Recommendations Follow surgeon's recommendation for DC plan and follow-up therapies    Equipment Recommendations  Rolling walker with 5" wheels    Recommendations for Other Services       Precautions / Restrictions Precautions Precautions: Knee Required Braces or Orthoses: Knee Immobilizer - Left Restrictions Weight Bearing Restrictions: No Other Position/Activity Restrictions: WBAT      Mobility  Bed Mobility Overal bed mobility: Needs Assistance Bed Mobility: Supine to Sit;Sit to Supine     Supine to sit: Min assist Sit to supine: Min assist   General bed mobility comments: assist for L LE  Transfers Overall transfer level: Needs assistance Equipment used: Rolling walker (2 wheeled) Transfers: Sit to/from Stand Sit to Stand: Min guard         General transfer comment: verbal cues for UE and LE positioning  Ambulation/Gait Ambulation/Gait assistance: Min guard Gait Distance (Feet): 17 Feet Assistive device: Rolling walker (2 wheeled) Gait Pattern/deviations: Step-to pattern;Decreased stance time - left;Trunk flexed;Antalgic     General Gait Details: verbal cues for RW positioning, step length, posture, limited to within room ambulation due to pain  Stairs            Wheelchair Mobility    Modified Rankin (Stroke Patients Only)        Balance                                             Pertinent Vitals/Pain Pain Assessment: 0-10 Pain Score: 8  Pain Location: L knee Pain Descriptors / Indicators: Sore;Aching Pain Intervention(s): Limited activity within patient's tolerance;Monitored during session;Repositioned    Home Living Family/patient expects to be discharged to:: Private residence Living Arrangements: Spouse/significant other   Type of Home: House Home Access: Stairs to enter Entrance Stairs-Rails: None Secretary/administrator of Steps: 3 Home Layout: One level Home Equipment: None      Prior Function Level of Independence: Independent               Hand Dominance        Extremity/Trunk Assessment        Lower Extremity Assessment Lower Extremity Assessment: LLE deficits/detail LLE Deficits / Details: unable to perform SLR, ROM not tested due to increased pain, maintained KI       Communication   Communication: No difficulties  Cognition Arousal/Alertness: Awake/alert Behavior During Therapy: WFL for tasks assessed/performed Overall Cognitive Status: Within Functional Limits for tasks assessed                                        General Comments      Exercises  Assessment/Plan    PT Assessment Patient needs continued PT services  PT Problem List Decreased strength;Decreased mobility;Decreased range of motion;Pain;Decreased knowledge of use of DME;Decreased knowledge of precautions       PT Treatment Interventions Stair training;Therapeutic exercise;Therapeutic activities;Gait training;Functional mobility training;DME instruction;Patient/family education    PT Goals (Current goals can be found in the Care Plan section)  Acute Rehab PT Goals PT Goal Formulation: With patient Time For Goal Achievement: 06/17/18 Potential to Achieve Goals: Good    Frequency 7X/week   Barriers to discharge        Co-evaluation                AM-PAC PT "6 Clicks" Mobility  Outcome Measure Help needed turning from your back to your side while in a flat bed without using bedrails?: A Little Help needed moving from lying on your back to sitting on the side of a flat bed without using bedrails?: A Little Help needed moving to and from a bed to a chair (including a wheelchair)?: A Little Help needed standing up from a chair using your arms (e.g., wheelchair or bedside chair)?: A Little Help needed to walk in hospital room?: A Little Help needed climbing 3-5 steps with a railing? : A Lot 6 Click Score: 17    End of Session Equipment Utilized During Treatment: Gait belt;Left knee immobilizer Activity Tolerance: Patient limited by pain Patient left: in bed;with call bell/phone within reach Nurse Communication: Mobility status;Patient requests pain meds PT Visit Diagnosis: Other abnormalities of gait and mobility (R26.89)    Time: 1001-1013 PT Time Calculation (min) (ACUTE ONLY): 12 min   Charges:   PT Evaluation $PT Eval Low Complexity: 1 Low         Zenovia Jarred, PT, DPT Acute Rehabilitation Services Office: [phone] Pager: [phone]  [name] 06/12/2018, 10:52 AM

## 2018-06-12 NOTE — Progress Notes (Signed)
Physical Therapy Treatment Patient Details Name: Kathryn Hicks MRN: [id number] DOB: 25-Nov-1955 Today's Date: 06/12/2018    History of Present Illness Pt is a 63 year old female s/p L TKA    PT Comments    Pt agreeable to session and assisted with exercises however reports pain "12-20"/10 and declined mobilizing. Pt reports decreased sensation over anterior lower leg, unable to actively perform L foot/ankle eversion, and decreased L DF actively compared to R DF.  RN notified.   Follow Up Recommendations  Follow surgeon's recommendation for DC plan and follow-up therapies     Equipment Recommendations  Rolling walker with 5" wheels    Recommendations for Other Services       Precautions / Restrictions Precautions Precautions: Knee;Fall Required Braces or Orthoses: Knee Immobilizer - Left Restrictions Other Position/Activity Restrictions: WBAT    Mobility  Bed Mobility                  Transfers                    Ambulation/Gait                 Stairs             Wheelchair Mobility    Modified Rankin (Stroke Patients Only)       Balance                                            Cognition Arousal/Alertness: Awake/alert Behavior During Therapy: WFL for tasks assessed/performed Overall Cognitive Status: Within Functional Limits for tasks assessed                                        Exercises Total Joint Exercises Ankle Circles/Pumps: 10 reps;AAROM;Both Quad Sets: AROM;10 reps;Left Short Arc Quad: AAROM;10 reps;Left Heel Slides: AAROM;10 reps;Left Hip ABduction/ADduction: AAROM;10 reps;Left Goniometric ROM: L knee AAROM flexion approx 40* limited by pain    General Comments        Pertinent Vitals/Pain Pain Assessment: 0-10 Pain Score: 10-Worst pain ever Pain Location: L thigh and lower leg Pain Descriptors / Indicators: Sore;Aching Pain Intervention(s): Monitored during  session;Patient requesting pain meds-RN notified;Premedicated before session;Limited activity within patient's tolerance    Home Living                      Prior Function            PT Goals (current goals can now be found in the care plan section) Progress towards PT goals: Progressing toward goals    Frequency    7X/week      PT Plan Current plan remains appropriate    Co-evaluation              AM-PAC PT "6 Clicks" Mobility   Outcome Measure  Help needed turning from your back to your side while in a flat bed without using bedrails?: A Little Help needed moving from lying on your back to sitting on the side of a flat bed without using bedrails?: A Little Help needed moving to and from a bed to a chair (including a wheelchair)?: A Little Help needed standing up from a chair using your arms (e.g., wheelchair or bedside chair)?: A Little Help needed  to walk in hospital room?: A Little Help needed climbing 3-5 steps with a railing? : A Lot 6 Click Score: 17    End of Session   Activity Tolerance: Patient limited by pain Patient left: in bed;with call bell/phone within reach Nurse Communication: Mobility status;Patient requests pain meds PT Visit Diagnosis: Other abnormalities of gait and mobility (R26.89)     Time: 1610-9604 PT Time Calculation (min) (ACUTE ONLY): 12 min   Zenovia Jarred, PT, DPT Acute Rehabilitation Services Office: [phone] Pager: [phone]   [name] 06/12/2018, 3:05 PM

## 2018-06-12 NOTE — Progress Notes (Signed)
   Subjective: 1 Day Post-Op Procedure(s) (LRB): TOTAL KNEE ARTHROPLASTY (Left) Patient reports pain as moderate.   Patient seen in rounds for Dr. Darrelyn Hillock. Patient is well, and has had no acute complaints or problems other than pain in the left knee. She had some issues with pain management overnight but seems to be improving this morning. No SOB or chest pain. Reports that she did get up and walk some already.   Objective: Vital signs in last 24 hours: Temp:  [97.4 F (36.3 C)-98.6 F (37 C)] 98 F (36.7 C) (03/11 0454) Pulse Rate:  [63-84] 84 (03/11 0454) Resp:  [11-27] 17 (03/11 0130) BP: (87-149)/(51-101) 131/101 (03/11 0454) SpO2:  [96 %-100 %] 98 % (03/11 0454) Weight:  [77.2 kg] 77.2 kg (03/10 1102)  Intake/Output from previous day:  Intake/Output Summary (Last 24 hours) at 06/12/2018 0825 Last data filed at 06/12/2018 0600 Gross per 24 hour  Intake 3072.17 ml  Output 535 ml  Net 2537.17 ml     Labs: Recent Labs    06/12/18 0535  HGB 12.5   Recent Labs    06/12/18 0535  WBC 7.2  RBC 3.88  HCT 38.5  PLT 147*   Recent Labs    06/12/18 0535  NA 133*  K 3.9  CL 97*  CO2 26  BUN 10  CREATININE 0.79  GLUCOSE 131*  CALCIUM 8.8*    EXAM General - Patient is Alert and Oriented Extremity - Neurologically intact Intact pulses distally Dorsiflexion/Plantar flexion intact No cellulitis present Compartment soft Dressing/Incision - clean, dry, no drainage Motor Function - intact, moving foot and toes well on exam.   Past Medical History:  Diagnosis Date  . Anxiety   . Depression   . GERD (gastroesophageal reflux disease)   . Hypertension   . Hypothyroidism    followed by pcp  . Migraines   . OA (osteoarthritis)    knees  . PONV (postoperative nausea and vomiting)   . Wears glasses     Assessment/Plan: 1 Day Post-Op Procedure(s) (LRB): TOTAL KNEE ARTHROPLASTY (Left) Active Problems:   History of total knee arthroplasty, left  Estimated body  mass index is 30.64 kg/m as calculated from the following:   Height as of this encounter: 5' 2.5" (1.588 m).   Weight as of this encounter: 77.2 kg. Advance diet Up with therapy D/C IV fluids when tolerating POs well  DVT Prophylaxis - Xarelto Weight-Bearing as tolerated   Anticipated LOS equal to or greater than 2 midnights due to - Age 64 and older with one or more of the following:  - Obesity  - Expected need for hospital services (PT, OT, Nursing) required for safe  discharge  - Anticipated need for postoperative skilled nursing care or inpatient rehab  - Active co-morbidities: None OR   - Unanticipated findings during/Post Surgery: None  - Patient is a high risk of re-admission due to: None  Plan for DC home tomorrow pending progress. Will make sure that she has equipment that she needs. Discontinue foley after first session of therapy. Start to wean off IV medications.   Dimitri Ped, PA-C Orthopaedic Surgery 06/12/2018, 8:25 AM

## 2018-06-12 NOTE — Plan of Care (Signed)
  Problem: Pain Managment: Goal: General experience of comfort will improve Outcome: Completed/Met   Problem: Coping: Goal: Level of anxiety will decrease Outcome: Completed/Met   Problem: Nutrition: Goal: Adequate nutrition will be maintained Outcome: Completed/Met   Problem: Activity: Goal: Risk for activity intolerance will decrease Outcome: Completed/Met   Problem: Clinical Measurements: Goal: Cardiovascular complication will be avoided Outcome: Completed/Met

## 2018-06-13 ENCOUNTER — Encounter (HOSPITAL_COMMUNITY): Payer: Self-pay | Admitting: Orthopedic Surgery

## 2018-06-13 ENCOUNTER — Inpatient Hospital Stay (HOSPITAL_COMMUNITY)

## 2018-06-13 DIAGNOSIS — R609 Edema, unspecified: Secondary | ICD-10-CM

## 2018-06-13 DIAGNOSIS — M7989 Other specified soft tissue disorders: Secondary | ICD-10-CM

## 2018-06-13 LAB — BASIC METABOLIC PANEL
Anion gap: 7 (ref 5–15)
BUN: 6 mg/dL — ABNORMAL LOW (ref 8–23)
CO2: 30 mmol/L (ref 22–32)
Calcium: 8.5 mg/dL — ABNORMAL LOW (ref 8.9–10.3)
Chloride: 92 mmol/L — ABNORMAL LOW (ref 98–111)
Creatinine, Ser: 0.71 mg/dL (ref 0.44–1.00)
GFR calc Af Amer: >60 mL/min (ref >60)
GFR calc non Af Amer: >60 mL/min (ref >60)
Glucose, Bld: 128 mg/dL — ABNORMAL HIGH (ref 70–99)
Potassium: 3.8 mmol/L (ref 3.5–5.1)
Sodium: 129 mmol/L — ABNORMAL LOW (ref 135–145)

## 2018-06-13 LAB — CBC
HEMATOCRIT: 36 % (ref 36.0–46.0)
Hemoglobin: 12.1 g/dL (ref 12.0–15.0)
MCH: 32.5 pg (ref 26.0–34.0)
MCHC: 33.6 g/dL (ref 30.0–36.0)
MCV: 96.8 fL (ref 80.0–100.0)
Platelets: 148 10*3/uL — ABNORMAL LOW (ref 150–400)
RBC: 3.72 MIL/uL — ABNORMAL LOW (ref 3.87–5.11)
RDW: 12.2 % (ref 11.5–15.5)
WBC: 9.8 10*3/uL (ref 4.0–10.5)
nRBC: 0 % (ref 0.0–0.2)

## 2018-06-13 MED ORDER — RIVAROXABAN 10 MG PO TABS: 10.0000 mg | ORAL_TABLET | Freq: Every day | ORAL | 0 refills | Status: DC

## 2018-06-13 MED ORDER — OXYCODONE HCL 5 MG PO TABS: 5.0000 mg | ORAL_TABLET | Freq: Four times a day (QID) | ORAL | 0 refills | Status: DC | PRN

## 2018-06-13 NOTE — Progress Notes (Signed)
At 1200, the pt and pt's Husband were provided with d/c instructions. After discussing the pt's plan of care upon d/c home, the pt reported no further questions or concerns.

## 2018-06-13 NOTE — Progress Notes (Signed)
Lower extremity venous has been completed.   Preliminary results in CV Proc.   Blanch Media 06/13/2018 10:12 AM

## 2018-06-13 NOTE — Progress Notes (Signed)
   Subjective: 2 Days Post-Op Procedure(s) (LRB): TOTAL KNEE ARTHROPLASTY (Left) Patient reports pain as moderate.   Patient seen in rounds with Dr. Darrelyn Hillock. Patient is well, but has had some minor complaints of pain in her left knee and calf. No SOB or chest pain. Voiding well. Positive flatus.    Objective: Vital signs in last 24 hours: Temp:  [98.1 F (36.7 C)-100.1 F (37.8 C)] 100.1 F (37.8 C) (03/12 0459) Pulse Rate:  [79-93] 93 (03/12 0459) Resp:  [15-17] 17 (03/12 0459) BP: (119-140)/(68-75) 140/68 (03/12 0459) SpO2:  [96 %-99 %] 96 % (03/12 0459)  Intake/Output from previous day:  Intake/Output Summary (Last 24 hours) at 06/13/2018 0740 Last data filed at 06/13/2018 0600 Gross per 24 hour  Intake 960 ml  Output 1300 ml  Net -340 ml     Labs: Recent Labs    06/12/18 0535 06/13/18 0530  HGB 12.5 12.1   Recent Labs    06/12/18 0535 06/13/18 0530  WBC 7.2 9.8  RBC 3.88 3.72*  HCT 38.5 36.0  PLT 147* 148*   Recent Labs    06/12/18 0535 06/13/18 0530  NA 133* 129*  K 3.9 3.8  CL 97* 92*  CO2 26 30  BUN 10 6*  CREATININE 0.79 0.71  GLUCOSE 131* 128*  CALCIUM 8.8* 8.5*    EXAM General - Patient is Alert and Oriented Extremity - Neurologically intact Sensation intact distally Intact pulses distally Dorsiflexion/Plantar flexion intact No cellulitis present Compartment soft but swollen and tender. Negative Homan's sign Dressing/Incision - clean, dry, no drainage Motor Function - intact, moving foot and toes well on exam.   Past Medical History:  Diagnosis Date  . Anxiety   . Depression   . GERD (gastroesophageal reflux disease)   . Hypertension   . Hypothyroidism    followed by pcp  . Migraines   . OA (osteoarthritis)    knees  . PONV (postoperative nausea and vomiting)   . Wears glasses     Assessment/Plan: 2 Days Post-Op Procedure(s) (LRB): TOTAL KNEE ARTHROPLASTY (Left) Active Problems:   History of total knee arthroplasty,  left  Estimated body mass index is 30.64 kg/m as calculated from the following:   Height as of this encounter: 5' 2.5" (1.588 m).   Weight as of this encounter: 77.2 kg. Advance diet Up with therapy D/C IV fluids DC home today pending progress DVT Prophylaxis - Xarelto and SCDs Weight-Bearing as tolerated   Continue therapy. Will get doppler of left calf to rule out DVT. Plan for DC home this afternoon. Follow up in 2 weeks.   Dimitri Ped, PA-C Orthopaedic Surgery 06/13/2018, 7:40 AM

## 2018-06-13 NOTE — Progress Notes (Signed)
Physical Therapy Treatment Patient Details Name: Kathryn Hicks MRN: [id number] DOB: February 25, 1956 Today's Date: 06/13/2018    History of Present Illness Pt is a 63 year old female s/p L TKA    PT Comments    Pt reports pain remains however a little more tolerable today.  Pt only agreeable to practice steps.  Pt performed steps and provided with stair handout.  Pt declined performing exercises and wished to d/c home.  Pt provided with HEP handout as well.   Follow Up Recommendations  Follow surgeon's recommendation for DC plan and follow-up therapies     Equipment Recommendations  Rolling walker with 5" wheels    Recommendations for Other Services       Precautions / Restrictions Precautions Precautions: Knee;Fall Required Braces or Orthoses: Knee Immobilizer - Left Restrictions Other Position/Activity Restrictions: WBAT    Mobility  Bed Mobility Overal bed mobility: Needs Assistance Bed Mobility: Supine to Sit;Sit to Supine     Supine to sit: Min guard Sit to supine: Min guard   General bed mobility comments: cues for self assist  Transfers Overall transfer level: Needs assistance Equipment used: Rolling walker (2 wheeled) Transfers: Sit to/from Stand Sit to Stand: Min guard         General transfer comment: verbal cues for UE and LE positioning  Ambulation/Gait Ambulation/Gait assistance: Min guard Gait Distance (Feet): 18 Feet Assistive device: Rolling walker (2 wheeled) Gait Pattern/deviations: Step-to pattern;Decreased stance time - left;Trunk flexed;Antalgic     General Gait Details: verbal cues for RW positioning, step length, posture, pt only agreeable to ambulate to/from stairs (just outside pt's room)   Stairs Stairs: Yes Stairs assistance: Min assist Stair Management: Step to pattern;Backwards;With walker Number of Stairs: 2 General stair comments: verbal cues for sequence, RW positioning, safety; min assist with first attempt however pt  performed again min/guard assist; provided handout since no family present   Wheelchair Mobility    Modified Rankin (Stroke Patients Only)       Balance                                            Cognition Arousal/Alertness: Awake/alert Behavior During Therapy: WFL for tasks assessed/performed Overall Cognitive Status: Within Functional Limits for tasks assessed                                        Exercises      General Comments        Pertinent Vitals/Pain Pain Assessment: 0-10 Pain Score: 7  Pain Location: L thigh and lower leg Pain Descriptors / Indicators: Sore;Aching Pain Intervention(s): Limited activity within patient's tolerance;Monitored during session;Repositioned;Premedicated before session    Home Living                      Prior Function            PT Goals (current goals can now be found in the care plan section) Progress towards PT goals: Progressing toward goals    Frequency    7X/week      PT Plan Current plan remains appropriate    Co-evaluation              AM-PAC PT "6 Clicks" Mobility   Outcome Measure  Help needed turning  from your back to your side while in a flat bed without using bedrails?: A Little Help needed moving from lying on your back to sitting on the side of a flat bed without using bedrails?: A Little Help needed moving to and from a bed to a chair (including a wheelchair)?: A Little Help needed standing up from a chair using your arms (e.g., wheelchair or bedside chair)?: A Little Help needed to walk in hospital room?: A Little Help needed climbing 3-5 steps with a railing? : A Little 6 Click Score: 18    End of Session Equipment Utilized During Treatment: Gait belt;Left knee immobilizer Activity Tolerance: Patient limited by pain Patient left: in bed;with call bell/phone within reach Nurse Communication: Mobility status PT Visit Diagnosis: Other  abnormalities of gait and mobility (R26.89)     Time: 0454-0981 PT Time Calculation (min) (ACUTE ONLY): 16 min  Charges:  $Gait Training: 8-22 mins                     Zenovia Jarred, PT, DPT Acute Rehabilitation Services Office: [phone] Pager: [phone]  [name] 06/13/2018, 4:18 PM

## 2018-06-13 NOTE — Plan of Care (Signed)
  Problem: Pain Management: Goal: Pain level will decrease with appropriate interventions Outcome: Progressing   Problem: Activity: Goal: Range of joint motion will improve Outcome: Progressing   Problem: Activity: Goal: Ability to avoid complications of mobility impairment will improve Outcome: Progressing

## 2018-06-16 NOTE — Discharge Summary (Signed)
Physician Discharge Summary   Patient ID: Kathryn Hicks MRN: [id number] DOB/AGE: Aug 23, 1955 63 y.o.  Admit date: 06/11/2018 Discharge date: 06/13/2018  Primary Diagnosis: Primary osteoarthritis left knee  Admission Diagnoses:  Past Medical History:  Diagnosis Date   Anxiety    Depression    GERD (gastroesophageal reflux disease)    Hypertension    Hypothyroidism    followed by pcp   Migraines    OA (osteoarthritis)    knees   PONV (postoperative nausea and vomiting)    Wears glasses    Discharge Diagnoses:   Active Problems:   History of total knee arthroplasty, left  Estimated body mass index is 30.64 kg/m as calculated from the following:   Height as of this encounter: 5' 2.5" (1.588 m).   Weight as of this encounter: 77.2 kg.  Procedure:  Procedure(s) (LRB): TOTAL KNEE ARTHROPLASTY (Left)   Consults: None  HPI: Kathryn Hicks, 63 y.o. female, has a history of pain and functional disability in the left knee due to arthritis and has failed non-surgical conservative treatments for greater than 12 weeks to includeNSAID's and/or analgesics, corticosteriod injections, flexibility and strengthening excercises and activity modification.  Onset of symptoms was gradual, starting 2 years ago with gradually worsening course since that time. The patient noted prior procedures on the knee to include  arthroscopy and menisectomy on the left knee(s).  Patient currently rates pain in the left knee(s) at 7 out of 10 with activity. Patient has night pain, worsening of pain with activity and weight bearing, pain that interferes with activities of daily living, pain with passive range of motion, crepitus and joint swelling.  Patient has evidence of periarticular osteophytes and joint space narrowing by imaging studies.  There is no active infection.   Laboratory Data: Admission on 06/11/2018, Discharged on 06/13/2018  Component Date Value Ref Range Status   WBC 06/12/2018 7.2   4.0 - 10.5 K/uL Final   RBC 06/12/2018 3.88  3.87 - 5.11 MIL/uL Final   Hemoglobin 06/12/2018 12.5  12.0 - 15.0 g/dL Final   HCT 40/98/1191 38.5  36.0 - 46.0 % Final   MCV 06/12/2018 99.2  80.0 - 100.0 fL Final   MCH 06/12/2018 32.2  26.0 - 34.0 pg Final   MCHC 06/12/2018 32.5  30.0 - 36.0 g/dL Final   RDW 47/82/9562 12.1  11.5 - 15.5 % Final   Platelets 06/12/2018 147* 150 - 400 K/uL Final   nRBC 06/12/2018 0.0  0.0 - 0.2 % Final   Performed at [facility], [address], [place], [place] [address]   Sodium 06/12/2018 133* 135 - 145 mmol/L Final   Potassium 06/12/2018 3.9  3.5 - 5.1 mmol/L Final   Chloride 06/12/2018 97* 98 - 111 mmol/L Final   CO2 06/12/2018 26  22 - 32 mmol/L Final   Glucose, Bld 06/12/2018 131* 70 - 99 mg/dL Final   BUN 57/84/6962 10  8 - 23 mg/dL Final   Creatinine, Ser 06/12/2018 0.79  0.44 - 1.00 mg/dL Final   Calcium 95/28/4132 8.8* 8.9 - 10.3 mg/dL Final   GFR calc non Af Amer 06/12/2018 >60  >60 mL/min Final   GFR calc Af Amer 06/12/2018 >60  >60 mL/min Final   Anion gap 06/12/2018 10  5 - 15 Final   Performed at [facility], [address] [place], [place] [address]   WBC 06/13/2018 9.8  4.0 - 10.5 K/uL Final   RBC 06/13/2018 3.72* 3.87 - 5.11 MIL/uL  Final   Hemoglobin 06/13/2018 12.1  12.0 - 15.0 g/dL Final   HCT 40/98/1191 36.0  36.0 - 46.0 % Final   MCV 06/13/2018 96.8  80.0 - 100.0 fL Final   MCH 06/13/2018 32.5  26.0 - 34.0 pg Final   MCHC 06/13/2018 33.6  30.0 - 36.0 g/dL Final   RDW 47/82/9562 12.2  11.5 - 15.5 % Final   Platelets 06/13/2018 148* 150 - 400 K/uL Final   nRBC 06/13/2018 0.0  0.0 - 0.2 % Final   Performed at [facility], [address], [place], [place] [address]   Sodium 06/13/2018 129* 135 - 145 mmol/L Final   Potassium 06/13/2018 3.8  3.5 - 5.1 mmol/L Final   Chloride 06/13/2018 92* 98 - 111 mmol/L Final   CO2 06/13/2018 30  22 - 32  mmol/L Final   Glucose, Bld 06/13/2018 128* 70 - 99 mg/dL Final   BUN 57/84/6962 6* 8 - 23 mg/dL Final   Creatinine, Ser 06/13/2018 0.71  0.44 - 1.00 mg/dL Final   Calcium 95/28/4132 8.5* 8.9 - 10.3 mg/dL Final   GFR calc non Af Amer 06/13/2018 >60  >60 mL/min Final   GFR calc Af Amer 06/13/2018 >60  >60 mL/min Final   Anion gap 06/13/2018 7  5 - 15 Final   Performed at [facility], [address] [place], [place] [address]  Hospital Outpatient Visit on 06/06/2018  Component Date Value Ref Range Status   aPTT 06/06/2018 31  24 - 36 seconds Final   Performed at [facility], [address], [place], [place] [address]   WBC 06/06/2018 7.1  4.0 - 10.5 K/uL Final   RBC 06/06/2018 4.35  3.87 - 5.11 MIL/uL Final   Hemoglobin 06/06/2018 13.9  12.0 - 15.0 g/dL Final   HCT 66/44/0347 43.4  36.0 - 46.0 % Final   MCV 06/06/2018 99.8  80.0 - 100.0 fL Final   MCH 06/06/2018 32.0  26.0 - 34.0 pg Final   MCHC 06/06/2018 32.0  30.0 - 36.0 g/dL Final   RDW 42/59/5638 12.3  11.5 - 15.5 % Final   Platelets 06/06/2018 211  150 - 400 K/uL Final   nRBC 06/06/2018 0.0  0.0 - 0.2 % Final   Neutrophils Relative % 06/06/2018 44  % Final   Neutro Abs 06/06/2018 3.2  1.7 - 7.7 K/uL Final   Lymphocytes Relative 06/06/2018 39  % Final   Lymphs Abs 06/06/2018 2.8  0.7 - 4.0 K/uL Final   Monocytes Relative 06/06/2018 12  % Final   Monocytes Absolute 06/06/2018 0.9  0.1 - 1.0 K/uL Final   Eosinophils Relative 06/06/2018 3  % Final   Eosinophils Absolute 06/06/2018 0.2  0.0 - 0.5 K/uL Final   Basophils Relative 06/06/2018 1  % Final   Basophils Absolute 06/06/2018 0.1  0.0 - 0.1 K/uL Final   Immature Granulocytes 06/06/2018 1  % Final   Abs Immature Granulocytes 06/06/2018 0.05  0.00 - 0.07 K/uL Final   Performed at [facility], [address], [place], [place] [address]   Sodium 06/06/2018 137  135 - 145 mmol/L Final    Potassium 06/06/2018 3.9  3.5 - 5.1 mmol/L Final   Chloride 06/06/2018 101  98 - 111 mmol/L Final   CO2 06/06/2018 29  22 - 32 mmol/L Final   Glucose, Bld 06/06/2018 85  70 - 99 mg/dL Final   BUN 32/95/1884 11  8 - 23 mg/dL Final  Creatinine, Ser 06/06/2018 0.82  0.44 - 1.00 mg/dL Final   Calcium 16/01/9603 9.4  8.9 - 10.3 mg/dL Final   Total Protein 54/12/8117 7.3  6.5 - 8.1 g/dL Final   Albumin 14/78/2956 4.0  3.5 - 5.0 g/dL Final   AST 21/30/8657 50* 15 - 41 U/L Final   ALT 06/06/2018 44  0 - 44 U/L Final   Alkaline Phosphatase 06/06/2018 53  38 - 126 U/L Final   Total Bilirubin 06/06/2018 0.7  0.3 - 1.2 mg/dL Final   GFR calc non Af Amer 06/06/2018 >60  >60 mL/min Final   GFR calc Af Amer 06/06/2018 >60  >60 mL/min Final   Anion gap 06/06/2018 7  5 - 15 Final   Performed at [facility], [address], [place], [place] [address]   Prothrombin Time 06/06/2018 12.9  11.4 - 15.2 seconds Final   INR 06/06/2018 1.0  0.8 - 1.2 Final   Comment: (NOTE) INR goal varies based on device and disease states. Performed at [facility], [address] [place], [place] [address]    ABO/RH(D) 06/06/2018 A POS   Final   Antibody Screen 06/06/2018 NEG   Final   Sample Expiration 06/06/2018 06/14/2018   Final   Extend sample reason 06/06/2018    Final                   Value:NO TRANSFUSIONS OR PREGNANCY IN THE PAST 3 MONTHS Performed at [facility], [address], [place], [place] [address]    Color, Urine 06/06/2018 YELLOW  YELLOW Final   APPearance 06/06/2018 CLEAR  CLEAR Final   Specific Gravity, Urine 06/06/2018 1.012  1.005 - 1.030 Final   pH 06/06/2018 6.0  5.0 - 8.0 Final   Glucose, UA 06/06/2018 NEGATIVE  NEGATIVE mg/dL Final   Hgb urine dipstick 06/06/2018 NEGATIVE  NEGATIVE Final   Bilirubin Urine 06/06/2018 NEGATIVE  NEGATIVE Final   Ketones, ur 06/06/2018 NEGATIVE  NEGATIVE mg/dL Final    Protein, ur 40/01/2724 NEGATIVE  NEGATIVE mg/dL Final   Nitrite 36/64/4034 NEGATIVE  NEGATIVE Final   Leukocytes,Ua 06/06/2018 NEGATIVE  NEGATIVE Final   Performed at [facility], [address] [place], [place] [address]  Hospital Outpatient Visit on 05/22/2018  Component Date Value Ref Range Status   aPTT 05/22/2018 31  24 - 36 seconds Final   Performed at [facility], [address], [place], [place] [address]   WBC 05/22/2018 6.2  4.0 - 10.5 K/uL Final   RBC 05/22/2018 4.23  3.87 - 5.11 MIL/uL Final   Hemoglobin 05/22/2018 13.7  12.0 - 15.0 g/dL Final   HCT 56/43/3295 42.4  36.0 - 46.0 % Final   MCV 05/22/2018 100.2* 80.0 - 100.0 fL Final   MCH 05/22/2018 32.4  26.0 - 34.0 pg Final   MCHC 05/22/2018 32.3  30.0 - 36.0 g/dL Final   RDW 18/84/1660 12.5  11.5 - 15.5 % Final   Platelets 05/22/2018 198  150 - 400 K/uL Final   nRBC 05/22/2018 0.0  0.0 - 0.2 % Final   Neutrophils Relative % 05/22/2018 54  % Final   Neutro Abs 05/22/2018 3.4  1.7 - 7.7 K/uL Final   Lymphocytes Relative 05/22/2018 30  % Final   Lymphs Abs 05/22/2018 1.9  0.7 - 4.0 K/uL Final   Monocytes Relative 05/22/2018 11  % Final   Monocytes Absolute 05/22/2018 0.7  0.1 - 1.0 K/uL Final  Eosinophils Relative 05/22/2018 3  % Final   Eosinophils Absolute 05/22/2018 0.2  0.0 - 0.5 K/uL Final   Basophils Relative 05/22/2018 1  % Final   Basophils Absolute 05/22/2018 0.1  0.0 - 0.1 K/uL Final   Immature Granulocytes 05/22/2018 1  % Final   Abs Immature Granulocytes 05/22/2018 0.03  0.00 - 0.07 K/uL Final   Performed at [facility], [address], [place], [place] [address]   Prothrombin Time 05/22/2018 13.2  11.4 - 15.2 seconds Final   INR 05/22/2018 1.01   Final   Performed at [facility], [address] [place], [place] [address]   ABO/RH(D) 05/22/2018 A POS   Final   Antibody Screen 05/22/2018 NEG   Final     Sample Expiration 05/22/2018 05/27/2018   Final   Extend sample reason 05/22/2018    Final                   Value:NO TRANSFUSIONS OR PREGNANCY IN THE PAST 3 MONTHS Performed at [facility], [address] [place], [place] [address]    Color, Urine 05/22/2018 STRAW* YELLOW Final   APPearance 05/22/2018 CLEAR  CLEAR Final   Specific Gravity, Urine 05/22/2018 1.009  1.005 - 1.030 Final   pH 05/22/2018 7.0  5.0 - 8.0 Final   Glucose, UA 05/22/2018 NEGATIVE  NEGATIVE mg/dL Final   Hgb urine dipstick 05/22/2018 NEGATIVE  NEGATIVE Final   Bilirubin Urine 05/22/2018 NEGATIVE  NEGATIVE Final   Ketones, ur 05/22/2018 NEGATIVE  NEGATIVE mg/dL Final   Protein, ur 21/30/8657 NEGATIVE  NEGATIVE mg/dL Final   Nitrite 84/69/6295 NEGATIVE  NEGATIVE Final   Leukocytes,Ua 05/22/2018 NEGATIVE  NEGATIVE Final   Performed at [facility], [address] [place], [place] [address]   MRSA, PCR 05/22/2018 NEGATIVE  NEGATIVE Final   Staphylococcus aureus 05/22/2018 NEGATIVE  NEGATIVE Final   Comment: (NOTE) The Xpert SA Assay (FDA approved for NASAL specimens in patients 34 years of age and older), is one component of a comprehensive surveillance program. It is not intended to diagnose infection nor to guide or monitor treatment. Performed at [facility], [address], [place], [place] [address]    ABO/RH(D) 05/22/2018    Final                   Value:A POS Performed at [facility], [address], [place], [place] [address]      X-Rays:Vas [place] Lower Extremity Venous (dvt)  Result Date: 06/13/2018  Lower Venous Study Indications: Swelling, and Edema.  Performing Technologist: Blanch Media RVS  Examination Guidelines: A complete evaluation includes B-mode imaging, spectral Doppler, color Doppler, and power Doppler as needed of all accessible portions of each vessel. Bilateral testing is considered an  integral part of a complete examination. Limited examinations for reoccurring indications may be performed as noted.  Left Venous Findings: +---------+---------------+---------+-----------+----------+--------------+            Compressibility Phasicity Spontaneity Properties Summary         +---------+---------------+---------+-----------+----------+--------------+  CFV       Full            Yes       Yes                                    +---------+---------------+---------+-----------+----------+--------------+  SFJ  Full                                                             +---------+---------------+---------+-----------+----------+--------------+  FV Prox   Full                                                             +---------+---------------+---------+-----------+----------+--------------+  FV Mid    Full                                                             +---------+---------------+---------+-----------+----------+--------------+  FV Distal Full                                                             +---------+---------------+---------+-----------+----------+--------------+  PFV       Full                                                             +---------+---------------+---------+-----------+----------+--------------+  POP       Full            Yes       Yes                                    +---------+---------------+---------+-----------+----------+--------------+  PTV       Full                                                             +---------+---------------+---------+-----------+----------+--------------+  PERO                                                       Not visualized  +---------+---------------+---------+-----------+----------+--------------+    Summary: Right: No evidence of common femoral vein obstruction. Left: There is no evidence of deep vein thrombosis in the lower extremity. No cystic structure found in the popliteal fossa.  *See table(s) above  for measurements and observations. Electronically signed by Lemar Livings MD on 06/13/2018 at 2:40:14 PM.    Final     EKG: Orders placed or performed during the hospital encounter of 05/22/18  EKG 12 lead   EKG 12 lead     Hospital Course: Kathryn Hicks is a 63 y.o. who was admitted to [facility]. They were brought to the operating room on 06/11/2018 and underwent Procedure(s): TOTAL KNEE ARTHROPLASTY.  Patient tolerated the procedure well and was later transferred to the recovery room and then to the orthopaedic floor for postoperative care.  They were given PO and IV analgesics for pain control following their surgery.  They were given 24 hours of postoperative antibiotics of  Anti-infectives (From admission, onward)   Start     Dose/Rate Route Frequency Ordered Stop   06/11/18 2000  ceFAZolin (ANCEF) IVPB 1 g/50 mL premix     1 g 100 mL/hr over 30 Minutes Intravenous Every 6 hours 06/11/18 1608 06/12/18 0201   06/11/18 1345  polymyxin B 500,000 Units, bacitracin 50,000 Units in sodium chloride 0.9 % 500 mL irrigation  Status:  Discontinued       As needed 06/11/18 1345 06/11/18 1500   06/11/18 1045  ceFAZolin (ANCEF) IVPB 2g/100 mL premix     2 g 200 mL/hr over 30 Minutes Intravenous On call to O.R. 06/11/18 1042 06/11/18 1317     and started on DVT prophylaxis in the form of Xarelto.   PT and OT were ordered for total joint protocol.  Discharge planning consulted to help with postop disposition and equipment needs.  Patient had a fair night on the evening of surgery other than issues with pain management.  They started to get up OOB with therapy on day one. Hemovac drain was pulled without difficulty.  Continued to work with therapy into day two.  Dressing was changed on day two and the incision was clean and dry. Left calf was quite tender, doppler ordered but was negative for DVT.  The patient had progressed with therapy and meeting their goals.  Incision was healing well.   Patient was seen in rounds and was ready to go home.   Diet: Cardiac diet Activity:WBAT Follow-up:in 2 weeks Disposition - Home Discharged Condition: stable   Discharge Instructions    Call MD / Call 911   Complete by:  As directed    If you experience chest pain or shortness of breath, CALL 911 and be transported to the hospital emergency room.  If you develope a fever above 101 F, pus (white drainage) or increased drainage or redness at the wound, or calf pain, call your surgeon's office.   Call MD / Call 911   Complete by:  As directed    If you experience chest pain or shortness of breath, CALL 911 and be transported to the hospital emergency room.  If you develope a fever above 101 F, pus (white drainage) or increased drainage or redness at the wound, or calf pain, call your surgeon's office.   Constipation Prevention   Complete by:  As directed    Drink plenty of fluids.  Prune juice may be helpful.  You may use a stool softener, such as Colace (over the counter) 100 mg twice a day.  Use MiraLax (over the counter) for constipation as needed.   Constipation Prevention   Complete by:  As directed    Drink plenty of fluids.  Prune juice may be helpful.  You may use a stool softener, such as Colace (over the counter) 100 mg twice a day.  Use MiraLax (over the counter) for constipation as needed.   Diet - low sodium heart healthy  Complete by:  As directed    Diet - low sodium heart healthy   Complete by:  As directed    Discharge instructions   Complete by:  As directed    Dr. Ranee Gosselinonald [facility] [address] [place], [place] [id number] [phone]  TOTAL KNEE REPLACEMENT POSTOPERATIVE DIRECTIONS  Knee Rehabilitation, Guidelines Following Surgery  Results after knee surgery are often greatly improved when you follow the exercise, range of motion and muscle strengthening exercises prescribed by your doctor. Safety measures are also important to protect  the knee from further injury. Any time any of these exercises cause you to have increased pain or swelling in your knee joint, decrease the amount until you are comfortable again and slowly increase them. If you have problems or questions, call your caregiver or physical therapist for advice.   HOME CARE INSTRUCTIONS  Remove items at home which could result in a fall. This includes throw rugs or furniture in walking pathways.  ICE to the affected knee every three hours for 30 minutes at a time and then as needed for pain and swelling.  Continue to use ice on the knee for pain and swelling from surgery. You may notice swelling that will progress down to the foot and ankle.  This is normal after surgery.  Elevate the leg when you are not up walking on it.   Continue to use the breathing machine which will help keep your temperature down.  It is common for your temperature to cycle up and down following surgery, especially at night when you are not up moving around and exerting yourself.  The breathing machine keeps your lungs expanded and your temperature down. Do not place pillow under knee, focus on keeping the knee straight while resting  DIET You may resume your previous home diet once your are discharged from the hospital.  DRESSING / WOUND CARE / SHOWERING You may shower 3 days after surgery, but keep the wounds dry during showering.  You may use an occlusive plastic wrap (Press'n Seal for example), NO SOAKING/SUBMERGING IN THE BATHTUB.  If the bandage gets wet, change with a clean dry gauze.  If the incision gets wet, pat the wound dry with a clean towel. You may start showering once you are discharged home but do not submerge the incision under water. Just pat the incision dry and apply a dry gauze dressing on daily. Change the surgical dressing daily and reapply a dry dressing each time.  ACTIVITY Walk with your walker as instructed. Use walker as long as suggested by your caregivers. Avoid  periods of inactivity such as sitting longer than an hour when not asleep. This helps prevent blood clots.  You may resume a sexual relationship in one month or when given the OK by your doctor.  You may return to work once you are cleared by your doctor.  Do not drive a car for 6 weeks or until released by you surgeon.  Do not drive while taking narcotics.  WEIGHT BEARING Weight bearing as tolerated with assist device (walker, cane, etc) as directed, use it as long as suggested by your surgeon or therapist, typically at least 4-6 weeks.  POSTOPERATIVE CONSTIPATION PROTOCOL Constipation - defined medically as fewer than three stools per week and severe constipation as less than one stool per week.  One of the most common issues patients have following surgery is constipation.  Even if you have a regular bowel pattern at home, your normal  regimen is likely to be disrupted due to multiple reasons following surgery.  Combination of anesthesia, postoperative narcotics, change in appetite and fluid intake all can affect your bowels.  In order to avoid complications following surgery, here are some recommendations in order to help you during your recovery period.  Colace (docusate) - Pick up an over-the-counter form of Colace or another stool softener and take twice a day as long as you are requiring postoperative pain medications.  Take with a full glass of water daily.  If you experience loose stools or diarrhea, hold the colace until you stool forms back up.  If your symptoms do not get better within 1 week or if they get worse, check with your doctor.  Dulcolax (bisacodyl) - Pick up over-the-counter and take as directed by the product packaging as needed to assist with the movement of your bowels.  Take with a full glass of water.  Use this product as needed if not relieved by Colace only.   MiraLax (polyethylene glycol) - Pick up over-the-counter to have on hand.  MiraLax is a solution that will  increase the amount of water in your bowels to assist with bowel movements.  Take as directed and can mix with a glass of water, juice, soda, coffee, or tea.  Take if you go more than two days without a movement. Do not use MiraLax more than once per day. Call your doctor if you are still constipated or irregular after using this medication for 7 days in a row.  If you continue to have problems with postoperative constipation, please contact the office for further assistance and recommendations.  If you experience "the worst abdominal pain ever" or develop nausea or vomiting, please contact the office immediatly for further recommendations for treatment.  ITCHING  If you experience itching with your medications, try taking only a single pain pill, or even half a pain pill at a time.  You can also use Benadryl over the counter for itching or also to help with sleep.   TED HOSE STOCKINGS Wear the elastic stockings on both legs for three weeks following surgery during the day but you may remove then at night for sleeping.  MEDICATIONS See your medication summary on the "After Visit Summary" that the nursing staff will review with you prior to discharge.  You may have some home medications which will be placed on hold until you complete the course of blood thinner medication.  It is important for you to complete the blood thinner medication as prescribed by your surgeon.  Continue your approved medications as instructed at time of discharge.  PRECAUTIONS If you experience chest pain or shortness of breath - call 911 immediately for transfer to the hospital emergency department.  If you develop a fever greater that 101 F, purulent drainage from wound, increased redness or drainage from wound, foul odor from the wound/dressing, or calf pain - CONTACT YOUR SURGEON.                                                   FOLLOW-UP APPOINTMENTS Make sure you keep all of your appointments after your operation  with your surgeon and caregivers. You should call the office at the above phone number and make an appointment for approximately two weeks after the date of your surgery or on the date  instructed by your surgeon outlined in the "After Visit Summary".   RANGE OF MOTION AND STRENGTHENING EXERCISES  Rehabilitation of the knee is important following a knee injury or an operation. After just a few days of immobilization, the muscles of the thigh which control the knee become weakened and shrink (atrophy). Knee exercises are designed to build up the tone and strength of the thigh muscles and to improve knee motion. Often times heat used for twenty to thirty minutes before working out will loosen up your tissues and help with improving the range of motion but do not use heat for the first two weeks following surgery. These exercises can be done on a training (exercise) mat, on the floor, on a table or on a bed. Use what ever works the best and is most comfortable for you Knee exercises include:  Leg Lifts - While your knee is still immobilized in a splint or cast, you can do straight leg raises. Lift the leg to 60 degrees, hold for 3 sec, and slowly lower the leg. Repeat 10-20 times 2-3 times daily. Perform this exercise against resistance later as your knee gets better.  Quad and Hamstring Sets - Tighten up the muscle on the front of the thigh (Quad) and hold for 5-10 sec. Repeat this 10-20 times hourly. Hamstring sets are done by pushing the foot backward against an object and holding for 5-10 sec. Repeat as with quad sets.  Leg Slides: Lying on your back, slowly slide your foot toward your buttocks, bending your knee up off the floor (only go as far as is comfortable). Then slowly slide your foot back down until your leg is flat on the floor again. Angel Wings: Lying on your back spread your legs to the side as far apart as you can without causing discomfort.  A rehabilitation program following serious knee  injuries can speed recovery and prevent re-injury in the future due to weakened muscles. Contact your doctor or a physical therapist for more information on knee rehabilitation.   IF YOU ARE TRANSFERRED TO A SKILLED REHAB FACILITY If the patient is transferred to a skilled rehab facility following release from the hospital, a list of the current medications will be sent to the facility for the patient to continue.  When discharged from the skilled rehab facility, please have the facility set up the patient's Home Health Physical Therapy prior to being released. Also, the skilled facility will be responsible for providing the patient with their medications at time of release from the facility to include their pain medication, the muscle relaxants, and their blood thinner medication. If the patient is still at the rehab facility at time of the two week follow up appointment, the skilled rehab facility will also need to assist the patient in arranging follow up appointment in our office and any transportation needs.  MAKE SURE YOU:  Understand these instructions.  Get help right away if you are not doing well or get worse.    Pick up stool softner and laxative for home use following surgery while on pain medications. Do not submerge incision under water. Please use good hand washing techniques while changing dressing each day. May shower starting three days after surgery. Please use a clean towel to pat the incision dry following showers. Continue to use ice for pain and swelling after surgery. Do not use any lotions or creams on the incision until instructed by your surgeon.   Increase activity slowly as tolerated   Complete  by:  As directed    Increase activity slowly as tolerated   Complete by:  As directed      Allergies as of 06/13/2018      Reactions   Butalbital Hives   Other reaction(s): Other (See Comments) unknown   Sporanos [itraconazole] Hives, Rash   Welts full body       Medication List    STOP taking these medications   HYDROcodone-acetaminophen 5-325 MG tablet Commonly known as:  NORCO/VICODIN     TAKE these medications   almotriptan 12.5 MG tablet Commonly known as:  AXERT Take 12.5 mg by mouth as needed for migraine. may repeat in 2 hours if needed   ALPRAZolam 1 MG tablet Commonly known as:  XANAX Take 1 mg by mouth at bedtime.   Biotin 5000 MCG Tabs Take 5,000 mcg by mouth daily.   calcium carbonate 1500 (600 Ca) MG Tabs tablet Commonly known as:  OSCAL Take 1,500 mg by mouth daily.   cholecalciferol 25 MCG (1000 UT) tablet Commonly known as:  VITAMIN D3 Take 1,000 Units by mouth daily.   esomeprazole 20 MG capsule Commonly known as:  NEXIUM Take 20 mg by mouth daily as needed.   estrogens (conjugated) 0.9 MG tablet Commonly known as:  PREMARIN Take 0.9 mg by mouth daily.   lamoTRIgine 150 MG tablet Commonly known as:  LAMICTAL Take 150 mg by mouth 2 (two) times daily.   levothyroxine 25 MCG tablet Commonly known as:  SYNTHROID, LEVOTHROID Take 25 mcg by mouth daily before breakfast.   methocarbamol 500 MG tablet Commonly known as:  ROBAXIN Take 1 tablet (500 mg total) by mouth every 6 (six) hours as needed for muscle spasms.   multivitamin with minerals Tabs tablet Take 1 tablet by mouth daily.   olmesartan-hydrochlorothiazide 40-25 MG tablet Commonly known as:  BENICAR HCT Take 1 tablet by mouth daily.   ondansetron 4 MG tablet Commonly known as:  ZOFRAN Take 1 tablet (4 mg total) by mouth every 6 (six) hours as needed for nausea.   OSTEO BI-FLEX TRIPLE STRENGTH PO Take 1 tablet by mouth daily.   oxyCODONE 5 MG immediate release tablet Commonly known as:  Oxy IR/ROXICODONE Take 1-2 tablets (5-10 mg total) by mouth every 6 (six) hours as needed for moderate pain (pain score 4-6).   pseudoephedrine 120 MG 12 hr tablet Commonly known as:  SUDAFED Take 120 mg by mouth every 12 (twelve) hours as needed for  congestion.   rivaroxaban 10 MG Tabs tablet Commonly known as:  XARELTO Take 1 tablet (10 mg total) by mouth daily with breakfast.   vitamin C 1000 MG tablet Take 1,000 mg by mouth daily.      Follow-up Information    [name], MD. Schedule an appointment as soon as possible for a visit on 06/27/2018.   Specialty:  Orthopedic Surgery Contact information: [address] [place] [address] [phone]           Signed: Dimitri Ped, PA-C Orthopaedic Surgery 06/16/2018, 5:25 PM

## 2020-06-29 NOTE — Progress Notes (Signed)
DUE TO COVID-19 ONLY ONE VISITOR IS ALLOWED TO COME WITH YOU AND STAY IN THE WAITING ROOM ONLY DURING PRE OP AND PROCEDURE DAY OF SURGERY. THE 1 VISITOR  MAY VISIT WITH YOU AFTER SURGERY IN YOUR PRIVATE ROOM DURING VISITING HOURS ONLY!  YOU NEED TO HAVE A COVID 19 TEST ON__4/8/22_____ @_______ , THIS TEST MUST BE DONE BEFORE SURGERY,  COVID TESTING SITE [address] [place] , IT IS ON THE RIGHT GOING OUT [address] APPROXIMATELY  2 MINUTES PAST [employer] ON THE RIGHT. ONCE YOUR COVID TEST IS COMPLETED,  PLEASE BEGIN THE QUARANTINE INSTRUCTIONS AS OUTLINED IN YOUR HANDOUT.                Kortnie Stovall  06/29/2020   Your procedure is scheduled on:  07/13/20  Report to [facility] Main  Entrance   Report to admitting at     0515 AM     Call this number if you have problems the morning of surgery [phone]    REMEMBER: NO  SOLID FOOD CANDY OR GUM AFTER MIDNIGHT. CLEAR LIQUIDS UNTIL  0415am        . NOTHING BY MOUTH EXCEPT CLEAR LIQUIDS UNTIL  0415am   . PLEASE FINISH ENSURE DRINK PER SURGEON ORDER  WHICH NEEDS TO BE COMPLETED AT  0415am   .      CLEAR LIQUID DIET   Foods Allowed                                                                    Coffee and tea, regular and decaf                            Fruit ices (not with fruit pulp)                                      Iced Popsicles                                    Carbonated beverages, regular and diet                                    Cranberry, grape and apple juices Sports drinks like Gatorade Lightly seasoned clear broth or consume(fat free) Sugar, honey syrup ___________________________________________________________________      BRUSH YOUR TEETH MORNING OF SURGERY AND RINSE YOUR MOUTH OUT, NO CHEWING GUM CANDY OR MINTS.     Take these medicines the morning of surgery with A SIP OF WATER: lamictal, synthroid   DO NOT TAKE ANY DIABETIC MEDICATIONS DAY OF YOUR  SURGERY                               You may not have any metal on your body including hair pins and              piercings  Do not wear jewelry,  make-up, lotions, powders or perfumes, deodorant             Do not wear nail polish on your fingernails.  Do not shave  48 hours prior to surgery.              Men may shave face and neck.   Do not bring valuables to the hospital. [place].  Contacts, dentures or bridgework may not be worn into surgery.  Leave suitcase in the car. After surgery it may be brought to your room.     Patients discharged the day of surgery will not be allowed to drive home. IF YOU ARE HAVING SURGERY AND GOING HOME THE SAME DAY, YOU MUST HAVE AN ADULT TO DRIVE YOU HOME AND BE WITH YOU FOR 24 HOURS. YOU MAY GO HOME BY TAXI OR UBER OR ORTHERWISE, BUT AN ADULT MUST ACCOMPANY YOU HOME AND STAY WITH YOU FOR 24 HOURS.  Name and phone number of your driver:  Special Instructions: N/A              Please read over the following fact sheets you were given: _____________________________________________________________________  [facility] - Preparing for Surgery Before surgery, you can play an important role.  Because skin is not sterile, your skin needs to be as free of germs as possible.  You can reduce the number of germs on your skin by washing with CHG (chlorahexidine gluconate) soap before surgery.  CHG is an antiseptic cleaner which kills germs and bonds with the skin to continue killing germs even after washing. Please DO NOT use if you have an allergy to CHG or antibacterial soaps.  If your skin becomes reddened/irritated stop using the CHG and inform your nurse when you arrive at Short Stay. Do not shave (including legs and underarms) for at least 48 hours prior to the first CHG shower.  You may shave your face/neck. Please follow these instructions carefully:  1.  Shower with CHG Soap the night before surgery and the   morning of Surgery.  2.  If you choose to wash your hair, wash your hair first as usual with your  normal  shampoo.  3.  After you shampoo, rinse your hair and body thoroughly to remove the  shampoo.                           4.  Use CHG as you would any other liquid soap.  You can apply chg directly  to the skin and wash                       Gently with a scrungie or clean washcloth.  5.  Apply the CHG Soap to your body ONLY FROM THE NECK DOWN.   Do not use on face/ open                           Wound or open sores. Avoid contact with eyes, ears mouth and genitals (private parts).                       Wash face,  Genitals (private parts) with your normal soap.             6.  Wash thoroughly, paying special attention  to the area where your surgery  will be performed.  7.  Thoroughly rinse your body with warm water from the neck down.  8.  DO NOT shower/wash with your normal soap after using and rinsing off  the CHG Soap.                9.  Pat yourself dry with a clean towel.            10.  Wear clean pajamas.            11.  Place clean sheets on your bed the night of your first shower and do not  sleep with pets. Day of Surgery : Do not apply any lotions/deodorants the morning of surgery.  Please wear clean clothes to the hospital/surgery center.  FAILURE TO FOLLOW THESE INSTRUCTIONS MAY RESULT IN THE CANCELLATION OF YOUR SURGERY PATIENT SIGNATURE_________________________________  NURSE SIGNATURE__________________________________  ________________________________________________________________________

## 2020-07-02 ENCOUNTER — Encounter (HOSPITAL_COMMUNITY)
Admission: RE | Admit: 2020-07-02 | Discharge: 2020-07-02 | Disposition: A | Discharge status: Home / Self Care | Payer: Medicare Other | Source: Ambulatory Visit | Attending: Orthopedic Surgery | Admitting: Orthopedic Surgery

## 2020-07-02 ENCOUNTER — Encounter (HOSPITAL_COMMUNITY): Payer: Self-pay

## 2020-07-02 ENCOUNTER — Other Ambulatory Visit: Payer: Self-pay

## 2020-07-02 DIAGNOSIS — Z01818 Encounter for other preprocedural examination: Secondary | ICD-10-CM | POA: Insufficient documentation

## 2020-07-02 HISTORY — DX: Pneumonia, unspecified organism: J18.9

## 2020-07-02 LAB — CBC
HCT: 40.6 % (ref 36.0–46.0)
Hemoglobin: 13.5 g/dL (ref 12.0–15.0)
MCH: 32.9 pg (ref 26.0–34.0)
MCHC: 33.3 g/dL (ref 30.0–36.0)
MCV: 99 fL (ref 80.0–100.0)
Platelets: 157 10*3/uL (ref 150–400)
RBC: 4.1 MIL/uL (ref 3.87–5.11)
RDW: 12.5 % (ref 11.5–15.5)
WBC: 7 10*3/uL (ref 4.0–10.5)
nRBC: 0 % (ref 0.0–0.2)

## 2020-07-02 LAB — TYPE AND SCREEN
ABO/RH(D): A POS
Antibody Screen: NEGATIVE

## 2020-07-02 LAB — COMPREHENSIVE METABOLIC PANEL
ALT: 63 U/L — ABNORMAL HIGH (ref 0–44)
AST: 74 U/L — ABNORMAL HIGH (ref 15–41)
Albumin: 4.1 g/dL (ref 3.5–5.0)
Alkaline Phosphatase: 62 U/L (ref 38–126)
Anion gap: 9 (ref 5–15)
BUN: 14 mg/dL (ref 8–23)
CO2: 24 mmol/L (ref 22–32)
Calcium: 9.5 mg/dL (ref 8.9–10.3)
Chloride: 104 mmol/L (ref 98–111)
Creatinine, Ser: 0.78 mg/dL (ref 0.44–1.00)
GFR, Estimated: >60 mL/min (ref >60)
Glucose, Bld: 167 mg/dL — ABNORMAL HIGH (ref 70–99)
Potassium: 3.5 mmol/L (ref 3.5–5.1)
Sodium: 137 mmol/L (ref 135–145)
Total Bilirubin: 0.5 mg/dL (ref 0.3–1.2)
Total Protein: 7.1 g/dL (ref 6.5–8.1)

## 2020-07-02 LAB — SURGICAL PCR SCREEN
MRSA, PCR: NEGATIVE
Staphylococcus aureus: NEGATIVE

## 2020-07-02 LAB — APTT: aPTT: 35 seconds (ref 24–36)

## 2020-07-02 LAB — PROTIME-INR
INR: 1 (ref 0.8–1.2)
Prothrombin Time: 13.1 seconds (ref 11.4–15.2)

## 2020-07-02 NOTE — Progress Notes (Addendum)
Anesthesia Review:  PCP: DR Valentino Nose [place] in [place] for [place] at [facility] and requested clearance.  Called and requested clearance and LOV note from DR [place] at [facility].  [phone].  Cardiologist : none  Chest x-ray : EKG :07/02/20 Echo : Stress test: Cardiac Cath :  Activity level: can do a fight of stairs without difficulty  Sleep Study/ CPAP : no  Fasting Blood Sugar :      / Checks Blood Sugar -- times a day:   Blood Thinner/ Instructions /Last Dose: ASA / Instructions/ Last Dose :

## 2020-07-05 NOTE — H&P (Signed)
TOTAL KNEE ADMISSION H&P  Patient is being admitted for right total knee arthroplasty.  Subjective:  Chief Complaint:right knee pain.  HPI: Kathryn Hicks, 65 y.o. female, has a history of pain and functional disability in the right knee due to arthritis and has failed non-surgical conservative treatments for greater than 12 weeks to includeNSAID's and/or analgesics and activity modification.  Onset of symptoms was gradual, starting 2 years ago with gradually worsening course since that time. The patient noted no past surgery on the right knee(s).  Patient currently rates pain in the right knee(s) at 7 out of 10 with activity. Patient has worsening of pain with activity and weight bearing and pain that interferes with activities of daily living.  Patient has evidence of joint space narrowing by imaging studies.There is no active infection.  Patient Active Problem List   Diagnosis Date Noted  . 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 Anxiety   . Depression   . GERD (gastroesophageal reflux disease)   . Hypertension   . Hypothyroidism    followed by pcp  . Migraines   . OA (osteoarthritis)    knees  . Pneumonia    hx of   . PONV (postoperative nausea and vomiting)    woke up during knee surgery   . Wears glasses     Past Surgical History:  Procedure Laterality Date  . ABDOMINAL HYSTERECTOMY  2003    w/  Bilateral Salpingo-oophorectomy  . CARPAL TUNNEL RELEASE Left 2014  . CESAREAN SECTION  x2  last one 69  . COLONOSCOPY    . KNEE ARTHROSCOPY Left 06-21-2017   dr Darrelyn Hillock  . LAPAROSCOPIC CHOLECYSTECTOMY  2004  . 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   Procedure: TOTAL KNEE ARTHROPLASTY;  Surgeon: [name], MD;  Location: [facility];  Service: Orthopedics;  Laterality: Left;     No current facility-administered medications for this encounter.   Current Outpatient Medications  Medication Sig Dispense Refill  Last Dose  . almotriptan (AXERT) 12.5 MG tablet Take 12.5 mg by mouth as needed for migraine. may repeat in 2 hours if needed     . ALPRAZolam (XANAX) 1 MG tablet Take 1 mg by mouth at bedtime.     Marland Kitchen amLODipine (NORVASC) 2.5 MG tablet Take 2.5 mg by mouth daily.     . Biotin 5000 MCG TABS Take 5,000 mcg by mouth daily.      . Brimonidine Tartrate (LUMIFY) 0.025 % SOLN Place 1 drop into both eyes daily as needed (dry eyes).     . cholecalciferol (VITAMIN D3) 25 MCG (1000 UT) tablet Take 1,000 Units by mouth daily.     . diphenhydrAMINE (BENADRYL) 25 MG tablet Take 25 mg by mouth daily as needed for allergies.     Marland Kitchen esomeprazole (NEXIUM) 20 MG capsule Take 20 mg by mouth daily as needed.      Marland Kitchen estrogens, conjugated, (PREMARIN) 0.9 MG tablet Take 0.9 mg by mouth daily.      Marland Kitchen ibuprofen (ADVIL) 200 MG tablet Take 400 mg by mouth every 8 (eight) hours as needed for moderate pain.     Marland Kitchen lamoTRIgine (LAMICTAL) 150 MG tablet Take 150 mg by mouth 2 (two) times daily.     Marland Kitchen levothyroxine (SYNTHROID, LEVOTHROID) 25 MCG tablet Take 25 mcg by mouth daily before breakfast.     . Misc Natural Products (OSTEO BI-FLEX TRIPLE STRENGTH PO) Take 1 tablet by mouth daily.      Marland Kitchen  olmesartan-hydrochlorothiazide (BENICAR HCT) 40-25 MG tablet Take 1 tablet by mouth daily.     Marland Kitchen olopatadine (PATANOL) 0.1 % ophthalmic solution Place 1 drop into both eyes 2 (two) times daily as needed for allergies.     . Probiotic Product (PROBIOTIC DAILY PO) Take 1 capsule by mouth daily.      Allergies  Allergen Reactions  . Butalbital Hives    Other reaction(s): Other (See Comments) unknown  . Sporanos [Itraconazole] Hives and Rash    Welts full body    Social History   Tobacco Use  . Smoking status: Former Smoker    Years: 10.00    Types: Cigarettes    Quit date: 05/22/1977    Years since quitting: 43.1  . Smokeless tobacco: Never Used  Substance Use Topics  . Alcohol use: Never    Comment: rare    Family History   Problem Relation Age of Onset  . Prostate cancer Father   . Heart attack Father   . Wilm's tumor Daughter      Review of Systems  Constitutional: Negative for chills and fever.  Respiratory: Negative for cough and shortness of breath.   Cardiovascular: Negative for chest pain.  Gastrointestinal: Negative for nausea and vomiting.  Musculoskeletal: Positive for arthralgias.    Objective:  Physical Exam Well nourished and well developed.  General: Alert and oriented x3, cooperative and pleasant, no acute distress. Head: normocephalic, atraumatic, neck supple. Eyes: EOMI.  Musculoskeletal: Right knee exam: Today no palpable effusion, warmth or erythema Neutral alignment without flexion contracture She flexes over 120 degrees with tightness in the anterior medial aspect the knee Tenderness mainly over the medial and anterior aspect the knee Knee ligaments are stable.  Calves soft and nontender. Motor function intact in LE. Strength 5/5 LE bilaterally. Neuro: Distal pulses 2+. Sensation to light touch intact in LE.  Vital signs in last 24 hours:    Labs:   Estimated body mass index is 30.64 kg/m as calculated from the following:   Height as of 06/11/18: 5' 2.5" (1.588 m).   Weight as of 06/11/18: 77.2 kg.   Imaging Review Plain radiographs demonstrate severe degenerative joint disease of the right knee(s). The overall alignment isneutral. The bone quality appears to be adequate for age and reported activity level.      Assessment/Plan:  End stage arthritis, right knee   The patient history, physical examination, clinical judgment of the provider and imaging studies are consistent with end stage degenerative joint disease of the right knee(s) and total knee arthroplasty is deemed medically necessary. The treatment options including medical management, injection therapy arthroscopy and arthroplasty were discussed at length. The risks and benefits of total knee  arthroplasty were presented and reviewed. The risks due to aseptic loosening, infection, stiffness, patella tracking problems, thromboembolic complications and other imponderables were discussed. The patient acknowledged the explanation, agreed to proceed with the plan and consent was signed. Patient is being admitted for inpatient treatment for surgery, pain control, PT, OT, prophylactic antibiotics, VTE prophylaxis, progressive ambulation and ADL's and discharge planning. The patient is planning to be discharged home.  Therapy Plans: outpatient therapy at [facility] Disposition: Home with husband Planned DVT Prophylaxis: aspirin 81mg  BID DME needed: 3-n-1 PCP: Dr. , awaiting clearance - should have been sent TXA: IV Allergies: butalbital - unknown, spranox - hives Anesthesia Concerns: none BMI: 31.9 Not diabetic.  Other: - Oxycodone - elevated liver enzymes, avoids tylenol - Left Knee Sizes: 3 femur, 2.5 Tibia, 28  patella, 15 mm insert - Had metal allergy testing performed in 2020 prior to left TKA, negative results   Patient's anticipated LOS is less than 2 midnights, meeting these requirements: - Younger than 56 - Lives within 1 hour of care - Has a competent adult at home to recover with post-op recover - NO history of  - Chronic pain requiring opiods  - Diabetes  - Coronary Artery Disease  - Heart failure  - Heart attack  - Stroke  - DVT/VTE  - Cardiac arrhythmia  - Respiratory Failure/COPD  - Renal failure  - Anemia  - Advanced Liver disease  Dennie Bible, PA-C Orthopedic Surgery [facility] [place] [phone]

## 2020-07-09 ENCOUNTER — Other Ambulatory Visit (HOSPITAL_COMMUNITY)
Admission: RE | Admit: 2020-07-09 | Discharge: 2020-07-09 | Disposition: A | Discharge status: Home / Self Care | Payer: Medicare Other | Source: Ambulatory Visit | Attending: Orthopedic Surgery | Admitting: Orthopedic Surgery

## 2020-07-09 DIAGNOSIS — Z01812 Encounter for preprocedural laboratory examination: Secondary | ICD-10-CM | POA: Insufficient documentation

## 2020-07-09 DIAGNOSIS — Z20822 Contact with and (suspected) exposure to covid-19: Secondary | ICD-10-CM | POA: Insufficient documentation

## 2020-07-10 LAB — SARS CORONAVIRUS 2 (TAT 6-24 HRS): SARS Coronavirus 2: NEGATIVE

## 2020-07-12 ENCOUNTER — Encounter (HOSPITAL_COMMUNITY): Payer: Self-pay | Admitting: Orthopedic Surgery

## 2020-07-12 NOTE — Anesthesia Preprocedure Evaluation (Addendum)
Anesthesia Evaluation    Reviewed: Allergy & Precautions, Patient's Chart, lab work & pertinent test results  History of Anesthesia Complications (+) PONV and history of anesthetic complications  Airway Mallampati: II  TM Distance: >3 FB Neck ROM: Full    Dental no notable dental hx.    Pulmonary neg pulmonary ROS, former smoker,    Pulmonary exam normal breath sounds clear to auscultation       Cardiovascular hypertension, Pt. on medications negative cardio ROS Normal cardiovascular exam Rhythm:Regular Rate:Normal     Neuro/Psych  Headaches, PSYCHIATRIC DISORDERS Anxiety Depression    GI/Hepatic Neg liver ROS, GERD  Medicated,  Endo/Other  Hypothyroidism   Renal/GU negative Renal ROS  negative genitourinary   Musculoskeletal  (+) Arthritis , Osteoarthritis,    Abdominal   Peds  Hematology   Anesthesia Other Findings   Reproductive/Obstetrics negative OB ROS                            Anesthesia Physical  Anesthesia Plan  ASA: II  Anesthesia Plan: Spinal   Post-op Pain Management:    Induction:   PONV Risk Score and Plan: 3 and Treatment may vary due to age or medical condition, Ondansetron, Dexamethasone, Propofol infusion and Midazolam  Airway Management Planned: Natural Airway, Simple Face Mask and Nasal Cannula  Additional Equipment:   Intra-op Plan:   Post-operative Plan:   Informed Consent: I have reviewed the patients History and Physical, chart, labs and discussed the procedure including the risks, benefits and alternatives for the proposed anesthesia with the patient or authorized representative who has indicated his/her understanding and acceptance.     Dental advisory given  Plan Discussed with: CRNA and Anesthesiologist  Anesthesia Plan Comments:         Anesthesia Quick Evaluation

## 2020-07-13 ENCOUNTER — Encounter (HOSPITAL_COMMUNITY): Payer: Self-pay | Admitting: Orthopedic Surgery

## 2020-07-13 ENCOUNTER — Ambulatory Visit (HOSPITAL_COMMUNITY): Payer: Medicare Other | Admitting: Physician Assistant

## 2020-07-13 ENCOUNTER — Encounter (HOSPITAL_COMMUNITY)
Admission: RE | Disposition: A | Discharge status: Home / Self Care | Payer: Self-pay | Source: Ambulatory Visit | Attending: Orthopedic Surgery

## 2020-07-13 ENCOUNTER — Other Ambulatory Visit: Payer: Self-pay

## 2020-07-13 ENCOUNTER — Observation Stay (HOSPITAL_COMMUNITY)
Admission: RE | Admit: 2020-07-13 | Discharge: 2020-07-14 | Disposition: A | Discharge status: Home / Self Care | Payer: Medicare Other | Source: Ambulatory Visit | Attending: Orthopedic Surgery | Admitting: Orthopedic Surgery

## 2020-07-13 ENCOUNTER — Ambulatory Visit (HOSPITAL_COMMUNITY): Payer: Medicare Other | Admitting: Anesthesiology

## 2020-07-13 DIAGNOSIS — M25561 Pain in right knee: Secondary | ICD-10-CM | POA: Diagnosis present

## 2020-07-13 DIAGNOSIS — Z96652 Presence of left artificial knee joint: Secondary | ICD-10-CM | POA: Diagnosis not present

## 2020-07-13 DIAGNOSIS — M1711 Unilateral primary osteoarthritis, right knee: Principal | ICD-10-CM | POA: Insufficient documentation

## 2020-07-13 DIAGNOSIS — I10 Essential (primary) hypertension: Secondary | ICD-10-CM | POA: Diagnosis not present

## 2020-07-13 DIAGNOSIS — E039 Hypothyroidism, unspecified: Secondary | ICD-10-CM | POA: Insufficient documentation

## 2020-07-13 DIAGNOSIS — Z96651 Presence of right artificial knee joint: Secondary | ICD-10-CM

## 2020-07-13 DIAGNOSIS — Z79899 Other long term (current) drug therapy: Secondary | ICD-10-CM | POA: Diagnosis not present

## 2020-07-13 DIAGNOSIS — Z87891 Personal history of nicotine dependence: Secondary | ICD-10-CM | POA: Diagnosis not present

## 2020-07-13 HISTORY — PX: TOTAL KNEE ARTHROPLASTY: SHX125

## 2020-07-13 SURGERY — ARTHROPLASTY, KNEE, TOTAL
Anesthesia: Spinal | Site: Knee | Laterality: Right

## 2020-07-13 MED ORDER — DOCUSATE SODIUM 100 MG PO CAPS
100.0000 mg | ORAL_CAPSULE | Freq: Two times a day (BID) | ORAL | Status: DC
  Administered 2020-07-14: 100 mg via ORAL
  Filled 2020-07-13: qty 1

## 2020-07-13 MED ORDER — PROPOFOL 1000 MG/100ML IV EMUL
INTRAVENOUS | Status: AC
  Filled 2020-07-13: qty 100

## 2020-07-13 MED ORDER — POVIDONE-IODINE 10 % EX SWAB
2.0000 "application " | Freq: Once | CUTANEOUS | Status: AC
  Administered 2020-07-13: 2 via TOPICAL

## 2020-07-13 MED ORDER — AMLODIPINE BESYLATE 2.5 MG PO TABS
2.5000 mg | ORAL_TABLET | Freq: Every day | ORAL | Status: DC
  Administered 2020-07-14: 2.5 mg via ORAL
  Filled 2020-07-13 (×2): qty 1

## 2020-07-13 MED ORDER — SODIUM CHLORIDE (PF) 0.9 % IJ SOLN
INTRAMUSCULAR | Status: DC | PRN
  Administered 2020-07-13: 30 mL

## 2020-07-13 MED ORDER — ONDANSETRON HCL 4 MG/2ML IJ SOLN
INTRAMUSCULAR | Status: DC | PRN
  Administered 2020-07-13: 4 mg via INTRAVENOUS

## 2020-07-13 MED ORDER — METHOCARBAMOL 500 MG IVPB - SIMPLE MED
500.0000 mg | Freq: Four times a day (QID) | INTRAVENOUS | Status: DC | PRN
  Filled 2020-07-13: qty 50

## 2020-07-13 MED ORDER — MIDAZOLAM HCL 5 MG/5ML IJ SOLN
INTRAMUSCULAR | Status: DC | PRN
  Administered 2020-07-13: 2 mg via INTRAVENOUS

## 2020-07-13 MED ORDER — FERROUS SULFATE 325 (65 FE) MG PO TABS
325.0000 mg | ORAL_TABLET | Freq: Three times a day (TID) | ORAL | Status: DC
  Administered 2020-07-13 – 2020-07-14 (×2): 325 mg via ORAL
  Filled 2020-07-13 (×2): qty 1

## 2020-07-13 MED ORDER — CEFAZOLIN SODIUM-DEXTROSE 2-4 GM/100ML-% IV SOLN
2.0000 g | INTRAVENOUS | Status: AC
  Administered 2020-07-13: 2 g via INTRAVENOUS
  Filled 2020-07-13: qty 100

## 2020-07-13 MED ORDER — 0.9 % SODIUM CHLORIDE (POUR BTL) OPTIME
TOPICAL | Status: DC | PRN
  Administered 2020-07-13: 1000 mL

## 2020-07-13 MED ORDER — ONDANSETRON HCL 4 MG/2ML IJ SOLN: 4.0000 mg | Freq: Once | INTRAMUSCULAR | Status: DC | PRN

## 2020-07-13 MED ORDER — ONDANSETRON HCL 4 MG/2ML IJ SOLN
4.0000 mg | Freq: Four times a day (QID) | INTRAMUSCULAR | Status: DC | PRN
  Administered 2020-07-14 (×2): 4 mg via INTRAVENOUS
  Filled 2020-07-13 (×2): qty 2

## 2020-07-13 MED ORDER — PROPOFOL 10 MG/ML IV BOLUS
INTRAVENOUS | Status: DC | PRN
  Administered 2020-07-13: 30 mg via INTRAVENOUS

## 2020-07-13 MED ORDER — CEFAZOLIN SODIUM-DEXTROSE 2-4 GM/100ML-% IV SOLN
2.0000 g | Freq: Four times a day (QID) | INTRAVENOUS | Status: AC
Start: 2020-07-13 — End: 2020-07-13
  Administered 2020-07-13 (×2): 2 g via INTRAVENOUS
  Filled 2020-07-13 (×2): qty 100

## 2020-07-13 MED ORDER — LACTATED RINGERS IV SOLN: INTRAVENOUS | Status: DC

## 2020-07-13 MED ORDER — BRIMONIDINE TARTRATE 0.15 % OP SOLN: 1.0000 [drp] | Freq: Every day | OPHTHALMIC | Status: DC | PRN

## 2020-07-13 MED ORDER — SUMATRIPTAN SUCCINATE 50 MG PO TABS
50.0000 mg | ORAL_TABLET | ORAL | Status: DC | PRN
  Filled 2020-07-13: qty 1

## 2020-07-13 MED ORDER — DEXAMETHASONE SODIUM PHOSPHATE 10 MG/ML IJ SOLN
10.0000 mg | Freq: Once | INTRAMUSCULAR | Status: AC
  Administered 2020-07-14: 10 mg via INTRAVENOUS
  Filled 2020-07-13: qty 1

## 2020-07-13 MED ORDER — PROPOFOL 10 MG/ML IV BOLUS
INTRAVENOUS | Status: AC
  Filled 2020-07-13: qty 40

## 2020-07-13 MED ORDER — SODIUM CHLORIDE 0.9 % IV SOLN: INTRAVENOUS | Status: DC

## 2020-07-13 MED ORDER — SODIUM CHLORIDE (PF) 0.9 % IJ SOLN
INTRAMUSCULAR | Status: AC
  Filled 2020-07-13: qty 30

## 2020-07-13 MED ORDER — ACETAMINOPHEN 325 MG PO TABS: 325.0000 mg | ORAL_TABLET | Freq: Four times a day (QID) | ORAL | Status: DC | PRN

## 2020-07-13 MED ORDER — STERILE WATER FOR IRRIGATION IR SOLN
Status: DC | PRN
  Administered 2020-07-13: 2000 mL

## 2020-07-13 MED ORDER — OXYCODONE HCL 5 MG PO TABS
5.0000 mg | ORAL_TABLET | ORAL | Status: DC | PRN
  Administered 2020-07-13 (×2): 10 mg via ORAL
  Administered 2020-07-14: 5 mg via ORAL
  Administered 2020-07-14: 10 mg via ORAL
  Filled 2020-07-13 (×3): qty 2
  Filled 2020-07-13: qty 1

## 2020-07-13 MED ORDER — PHENYLEPHRINE HCL-NACL 10-0.9 MG/250ML-% IV SOLN
INTRAVENOUS | Status: AC
  Filled 2020-07-13: qty 750

## 2020-07-13 MED ORDER — POLYETHYLENE GLYCOL 3350 17 G PO PACK: 17.0000 g | PACK | Freq: Every day | ORAL | Status: DC | PRN

## 2020-07-13 MED ORDER — DEXAMETHASONE SODIUM PHOSPHATE 10 MG/ML IJ SOLN: 8.0000 mg | Freq: Once | INTRAMUSCULAR | Status: DC

## 2020-07-13 MED ORDER — CHLORHEXIDINE GLUCONATE 0.12 % MT SOLN
15.0000 mL | Freq: Once | OROMUCOSAL | Status: AC
  Administered 2020-07-13: 15 mL via OROMUCOSAL

## 2020-07-13 MED ORDER — TRANEXAMIC ACID-NACL 1000-0.7 MG/100ML-% IV SOLN
1000.0000 mg | Freq: Once | INTRAVENOUS | Status: AC
  Administered 2020-07-13: 1000 mg via INTRAVENOUS
  Filled 2020-07-13: qty 100

## 2020-07-13 MED ORDER — HYDROMORPHONE HCL 1 MG/ML IJ SOLN
0.5000 mg | INTRAMUSCULAR | Status: DC | PRN
  Administered 2020-07-13 – 2020-07-14 (×3): 1 mg via INTRAVENOUS
  Filled 2020-07-13 (×3): qty 1

## 2020-07-13 MED ORDER — ESTROGENS CONJUGATED 0.45 MG PO TABS
0.9000 mg | ORAL_TABLET | Freq: Every day | ORAL | Status: DC
  Filled 2020-07-13: qty 2

## 2020-07-13 MED ORDER — PANTOPRAZOLE SODIUM 40 MG PO TBEC
40.0000 mg | DELAYED_RELEASE_TABLET | Freq: Every day | ORAL | Status: DC | PRN
Start: 2020-07-13 — End: 2020-07-14
  Administered 2020-07-13: 40 mg via ORAL
  Filled 2020-07-13: qty 1

## 2020-07-13 MED ORDER — LEVOTHYROXINE SODIUM 25 MCG PO TABS: 25.0000 ug | ORAL_TABLET | Freq: Every day | ORAL | Status: DC

## 2020-07-13 MED ORDER — METOCLOPRAMIDE HCL 5 MG/ML IJ SOLN: 5.0000 mg | Freq: Three times a day (TID) | INTRAMUSCULAR | Status: DC | PRN

## 2020-07-13 MED ORDER — FENTANYL CITRATE (PF) 100 MCG/2ML IJ SOLN
INTRAMUSCULAR | Status: AC
  Filled 2020-07-13: qty 2

## 2020-07-13 MED ORDER — ACETAMINOPHEN 325 MG PO TABS: 325.0000 mg | ORAL_TABLET | ORAL | Status: DC | PRN

## 2020-07-13 MED ORDER — ALPRAZOLAM 1 MG PO TABS
1.0000 mg | ORAL_TABLET | Freq: Every day | ORAL | Status: DC
  Filled 2020-07-13: qty 1

## 2020-07-13 MED ORDER — METOCLOPRAMIDE HCL 5 MG PO TABS
5.0000 mg | ORAL_TABLET | Freq: Three times a day (TID) | ORAL | Status: DC | PRN
  Administered 2020-07-14: 5 mg via ORAL
  Filled 2020-07-13: qty 1

## 2020-07-13 MED ORDER — MEPERIDINE HCL 50 MG/ML IJ SOLN: 6.2500 mg | INTRAMUSCULAR | Status: DC | PRN

## 2020-07-13 MED ORDER — HYDROCHLOROTHIAZIDE 25 MG PO TABS
25.0000 mg | ORAL_TABLET | Freq: Every day | ORAL | Status: DC
  Administered 2020-07-14: 25 mg via ORAL
  Filled 2020-07-13: qty 1

## 2020-07-13 MED ORDER — PHENYLEPHRINE HCL-NACL 10-0.9 MG/250ML-% IV SOLN
INTRAVENOUS | Status: DC | PRN
  Administered 2020-07-13: 25 ug/min via INTRAVENOUS

## 2020-07-13 MED ORDER — PROPOFOL 500 MG/50ML IV EMUL
INTRAVENOUS | Status: DC | PRN
  Administered 2020-07-13: 110 ug/kg/min via INTRAVENOUS

## 2020-07-13 MED ORDER — SODIUM CHLORIDE 0.9 % IR SOLN
Status: DC | PRN
  Administered 2020-07-13: 1000 mL

## 2020-07-13 MED ORDER — DEXAMETHASONE SODIUM PHOSPHATE 10 MG/ML IJ SOLN
INTRAMUSCULAR | Status: DC | PRN
  Administered 2020-07-13: 10 mg via INTRAVENOUS

## 2020-07-13 MED ORDER — ESTROGENS CONJUGATED 0.45 MG PO TABS
0.9000 mg | ORAL_TABLET | Freq: Every day | ORAL | Status: DC
  Administered 2020-07-14: 0.9 mg via ORAL
  Filled 2020-07-13: qty 2

## 2020-07-13 MED ORDER — ASPIRIN 81 MG PO CHEW
81.0000 mg | CHEWABLE_TABLET | Freq: Two times a day (BID) | ORAL | Status: DC
  Administered 2020-07-13 – 2020-07-14 (×2): 81 mg via ORAL
  Filled 2020-07-13 (×2): qty 1

## 2020-07-13 MED ORDER — OLMESARTAN MEDOXOMIL-HCTZ 40-25 MG PO TABS: 1.0000 | ORAL_TABLET | Freq: Every day | ORAL | Status: DC

## 2020-07-13 MED ORDER — ORAL CARE MOUTH RINSE: 15.0000 mL | Freq: Once | OROMUCOSAL | Status: AC

## 2020-07-13 MED ORDER — DIPHENHYDRAMINE HCL 12.5 MG/5ML PO ELIX: 12.5000 mg | ORAL_SOLUTION | ORAL | Status: DC | PRN

## 2020-07-13 MED ORDER — BUPIVACAINE-EPINEPHRINE (PF) 0.25% -1:200000 IJ SOLN
INTRAMUSCULAR | Status: AC
  Filled 2020-07-13: qty 30

## 2020-07-13 MED ORDER — FENTANYL CITRATE (PF) 100 MCG/2ML IJ SOLN
INTRAMUSCULAR | Status: DC | PRN
  Administered 2020-07-13: 50 ug via INTRAVENOUS
  Administered 2020-07-13: 25 ug via INTRAVENOUS

## 2020-07-13 MED ORDER — OLOPATADINE HCL 0.1 % OP SOLN: 1.0000 [drp] | Freq: Two times a day (BID) | OPHTHALMIC | Status: DC | PRN

## 2020-07-13 MED ORDER — TRANEXAMIC ACID-NACL 1000-0.7 MG/100ML-% IV SOLN
1000.0000 mg | INTRAVENOUS | Status: AC
  Administered 2020-07-13: 1000 mg via INTRAVENOUS
  Filled 2020-07-13: qty 100

## 2020-07-13 MED ORDER — BISACODYL 10 MG RE SUPP: 10.0000 mg | Freq: Every day | RECTAL | Status: DC | PRN

## 2020-07-13 MED ORDER — KETOROLAC TROMETHAMINE 30 MG/ML IJ SOLN
INTRAMUSCULAR | Status: DC | PRN
  Administered 2020-07-13: 30 mg

## 2020-07-13 MED ORDER — BUPIVACAINE IN DEXTROSE 0.75-8.25 % IT SOLN
INTRATHECAL | Status: DC | PRN
  Administered 2020-07-13: 1.6 mL via INTRATHECAL

## 2020-07-13 MED ORDER — IRBESARTAN 150 MG PO TABS
300.0000 mg | ORAL_TABLET | Freq: Every day | ORAL | Status: DC
  Administered 2020-07-14: 300 mg via ORAL
  Filled 2020-07-13: qty 2

## 2020-07-13 MED ORDER — FENTANYL CITRATE (PF) 100 MCG/2ML IJ SOLN: 25.0000 ug | INTRAMUSCULAR | Status: DC | PRN

## 2020-07-13 MED ORDER — OXYCODONE HCL 5 MG/5ML PO SOLN: 5.0000 mg | Freq: Once | ORAL | Status: DC | PRN

## 2020-07-13 MED ORDER — OXYCODONE HCL 5 MG PO TABS: 5.0000 mg | ORAL_TABLET | Freq: Once | ORAL | Status: DC | PRN

## 2020-07-13 MED ORDER — ACETAMINOPHEN 160 MG/5ML PO SOLN: 325.0000 mg | ORAL | Status: DC | PRN

## 2020-07-13 MED ORDER — BUPIVACAINE-EPINEPHRINE (PF) 0.25% -1:200000 IJ SOLN
INTRAMUSCULAR | Status: DC | PRN
  Administered 2020-07-13: 30 mL

## 2020-07-13 MED ORDER — ROPIVACAINE HCL 7.5 MG/ML IJ SOLN
INTRAMUSCULAR | Status: DC | PRN
  Administered 2020-07-13: 25 mL via PERINEURAL

## 2020-07-13 MED ORDER — LIDOCAINE 2% (20 MG/ML) 5 ML SYRINGE
INTRAMUSCULAR | Status: AC
  Filled 2020-07-13: qty 15

## 2020-07-13 MED ORDER — METHOCARBAMOL 500 MG PO TABS
500.0000 mg | ORAL_TABLET | Freq: Four times a day (QID) | ORAL | Status: DC | PRN
  Administered 2020-07-13 – 2020-07-14 (×3): 500 mg via ORAL
  Filled 2020-07-13 (×3): qty 1

## 2020-07-13 MED ORDER — OXYCODONE HCL 5 MG PO TABS: 10.0000 mg | ORAL_TABLET | ORAL | Status: DC | PRN

## 2020-07-13 MED ORDER — PHENOL 1.4 % MT LIQD: 1.0000 | OROMUCOSAL | Status: DC | PRN

## 2020-07-13 MED ORDER — KETOROLAC TROMETHAMINE 30 MG/ML IJ SOLN
INTRAMUSCULAR | Status: AC
  Filled 2020-07-13: qty 1

## 2020-07-13 MED ORDER — LAMOTRIGINE 150 MG PO TABS
150.0000 mg | ORAL_TABLET | Freq: Two times a day (BID) | ORAL | Status: DC
  Administered 2020-07-13 – 2020-07-14 (×2): 150 mg via ORAL
  Filled 2020-07-13 (×2): qty 1

## 2020-07-13 MED ORDER — MIDAZOLAM HCL 2 MG/2ML IJ SOLN
INTRAMUSCULAR | Status: AC
  Filled 2020-07-13: qty 2

## 2020-07-13 MED ORDER — DEXAMETHASONE SODIUM PHOSPHATE 10 MG/ML IJ SOLN
INTRAMUSCULAR | Status: DC | PRN
  Administered 2020-07-13: 10 mg

## 2020-07-13 MED ORDER — LIDOCAINE 2% (20 MG/ML) 5 ML SYRINGE
INTRAMUSCULAR | Status: DC | PRN
  Administered 2020-07-13: 60 mg via INTRAVENOUS

## 2020-07-13 MED ORDER — ONDANSETRON HCL 4 MG PO TABS: 4.0000 mg | ORAL_TABLET | Freq: Four times a day (QID) | ORAL | Status: DC | PRN

## 2020-07-13 MED ORDER — MENTHOL 3 MG MT LOZG: 1.0000 | LOZENGE | OROMUCOSAL | Status: DC | PRN

## 2020-07-13 SURGICAL SUPPLY — 56 items
ADH SKN CLS APL DERMABOND .7 (GAUZE/BANDAGES/DRESSINGS) ×1
ATTUNE MED ANAT PAT 35 KNEE (Knees) ×1 IMPLANT
ATTUNE PSFEM RTSZ4 NARCEM KNEE (Femur) ×1 IMPLANT
ATTUNE PSRP INSR SZ4 6 KNEE (Insert) ×1 IMPLANT
BAG SPEC THK2 15X12 ZIP CLS (MISCELLANEOUS)
BAG ZIPLOCK 12X15 (MISCELLANEOUS) IMPLANT
BASE TIBIAL ROT PLAT SZ 3 KNEE (Knees) IMPLANT
BLADE SAW SGTL 11.0X1.19X90.0M (BLADE) IMPLANT
BLADE SAW SGTL 13.0X1.19X90.0M (BLADE) ×2 IMPLANT
BLADE SURG SZ10 CARB STEEL (BLADE) ×4 IMPLANT
BNDG ELASTIC 6X5.8 VLCR STR LF (GAUZE/BANDAGES/DRESSINGS) ×2 IMPLANT
BOWL SMART MIX CTS (DISPOSABLE) ×2 IMPLANT
BSPLAT TIB 3 CMNT ROT PLAT STR (Knees) ×1 IMPLANT
CEMENT HV SMART SET (Cement) ×2 IMPLANT
COVER WAND RF STERILE (DRAPES) IMPLANT
CUFF TOURN SGL QUICK 34 (TOURNIQUET CUFF) ×2
CUFF TRNQT CYL 34X4.125X (TOURNIQUET CUFF) ×1 IMPLANT
DECANTER SPIKE VIAL GLASS SM (MISCELLANEOUS) ×4 IMPLANT
DERMABOND ADVANCED (GAUZE/BANDAGES/DRESSINGS) ×1
DERMABOND ADVANCED .7 DNX12 (GAUZE/BANDAGES/DRESSINGS) ×1 IMPLANT
DRAPE U-SHAPE 47X51 STRL (DRAPES) ×2 IMPLANT
DRESSING AQUACEL AG SP 3.5X10 (GAUZE/BANDAGES/DRESSINGS) ×1 IMPLANT
DRSG AQUACEL AG ADV 3.5X10 (GAUZE/BANDAGES/DRESSINGS) ×1 IMPLANT
DRSG AQUACEL AG SP 3.5X10 (GAUZE/BANDAGES/DRESSINGS) ×2
DURAPREP 26ML APPLICATOR (WOUND CARE) ×4 IMPLANT
ELECT REM PT RETURN 15FT ADLT (MISCELLANEOUS) ×2 IMPLANT
GLOVE ORTHO TXT STRL SZ7.5 (GLOVE) ×2 IMPLANT
GLOVE SURG ENC MOIS LTX SZ6 (GLOVE) IMPLANT
GLOVE SURG UNDER POLY LF SZ6.5 (GLOVE) IMPLANT
GLOVE SURG UNDER POLY LF SZ7.5 (GLOVE) ×2 IMPLANT
GOWN STRL REUS W/TWL LRG LVL3 (GOWN DISPOSABLE) ×2 IMPLANT
HANDPIECE INTERPULSE COAX TIP (DISPOSABLE) ×2
HOLDER FOLEY CATH W/STRAP (MISCELLANEOUS) IMPLANT
KIT TURNOVER KIT A (KITS) ×2 IMPLANT
MANIFOLD NEPTUNE II (INSTRUMENTS) ×2 IMPLANT
NDL SAFETY ECLIPSE 18X1.5 (NEEDLE) IMPLANT
NEEDLE HYPO 18GX1.5 SHARP (NEEDLE)
NS IRRIG 1000ML POUR BTL (IV SOLUTION) ×2 IMPLANT
PACK TOTAL KNEE CUSTOM (KITS) ×2 IMPLANT
PENCIL SMOKE EVACUATOR (MISCELLANEOUS) IMPLANT
PIN DRILL FIX HALF THREAD (BIT) ×1 IMPLANT
PIN FIX SIGMA LCS THRD HI (PIN) ×1 IMPLANT
PROTECTOR NERVE ULNAR (MISCELLANEOUS) ×2 IMPLANT
SET HNDPC FAN SPRY TIP SCT (DISPOSABLE) ×1 IMPLANT
SET PAD KNEE POSITIONER (MISCELLANEOUS) ×2 IMPLANT
SUT MNCRL AB 4-0 PS2 18 (SUTURE) ×2 IMPLANT
SUT STRATAFIX PDS+ 0 24IN (SUTURE) ×2 IMPLANT
SUT VIC AB 1 CT1 36 (SUTURE) ×2 IMPLANT
SUT VIC AB 2-0 CT1 27 (SUTURE) ×6
SUT VIC AB 2-0 CT1 TAPERPNT 27 (SUTURE) ×3 IMPLANT
SYR 3ML LL SCALE MARK (SYRINGE) ×2 IMPLANT
TIBIAL BASE ROT PLAT SZ 3 KNEE (Knees) ×2 IMPLANT
TRAY FOLEY MTR SLVR 16FR STAT (SET/KITS/TRAYS/PACK) ×2 IMPLANT
TUBE SUCTION HIGH CAP CLEAR NV (SUCTIONS) ×2 IMPLANT
WATER STERILE IRR 1000ML POUR (IV SOLUTION) ×4 IMPLANT
WRAP KNEE MAXI GEL POST OP (GAUZE/BANDAGES/DRESSINGS) ×2 IMPLANT

## 2020-07-13 NOTE — Anesthesia Procedure Notes (Signed)
Anesthesia Regional Block: Adductor canal block   Pre-Anesthetic Checklist: ,, timeout performed, Correct Patient, Correct Site, Correct Laterality, Correct Procedure, Correct Position, site marked, Risks and benefits discussed,  Surgical consent,  Pre-op evaluation,  At surgeon's request and post-op pain management  Laterality: Right  Prep: chloraprep       Needles:  Injection technique: Single-shot  Needle Type: Echogenic Stimulator Needle     Needle Length: 5cm  Needle Gauge: 22     Additional Needles:   Procedures:, nerve stimulator,,, ultrasound used (permanent image in chart),,,,  Narrative:  Start time: 07/13/2020 6:55 AM End time: 07/13/2020 7:00 AM Injection made incrementally with aspirations every 5 mL.  Performed by: Personally  Anesthesiologist: Bethena Midget, MD  Additional Notes: Functioning IV was confirmed and monitors were applied.  A 5mm 22ga Arrow echogenic stimulator needle was used. Sterile prep and drape,hand hygiene and sterile gloves were used. Ultrasound guidance: relevant anatomy identified, needle position confirmed, local anesthetic spread visualized around nerve(s)., vascular puncture avoided.  Image printed for medical record. Negative aspiration and negative test dose prior to incremental administration of local anesthetic. The patient tolerated the procedure well.

## 2020-07-13 NOTE — Discharge Instructions (Signed)

## 2020-07-13 NOTE — Op Note (Signed)
NAME:  Kathryn Hicks                      MEDICAL RECORD NO.:  [id number]                             FACILITY:  [facility]      PHYSICIAN:  Madlyn Frankel. Charlann Boxer, M.D.  DATE OF BIRTH:  08-Jul-1955      DATE OF PROCEDURE:  07/13/2020                                     OPERATIVE REPORT         PREOPERATIVE DIAGNOSIS:  Right knee osteoarthritis.      POSTOPERATIVE DIAGNOSIS:  Right knee osteoarthritis.      FINDINGS:  The patient was noted to have complete loss of cartilage and   bone-on-bone arthritis with associated osteophytes in the medial and patellofemoral compartments of   the knee with a significant synovitis and associated effusion.  The patient had failed months of conservative treatment including medications, injection therapy, activity modification.     PROCEDURE:  Right total knee replacement.      COMPONENTS USED:  DePuy Attune rotating platform posterior stabilized knee   system, a size 4N femur, 3 tibia, size 6 mm PS AOX insert, and 35 anatomic patellar   button.      SURGEON:  Madlyn Frankel. Charlann Boxer, M.D.      ASSISTANT:  Dennie Bible, PA-C.      ANESTHESIA:  Regional and Spinal.      SPECIMENS:  None.      COMPLICATION:  None.      DRAINS:  None.  EBL: <100 cc      TOURNIQUET TIME:   Total Tourniquet Time Documented: Thigh (Right) - 29 minutes Total: Thigh (Right) - 29 minutes  .      The patient was stable to the recovery room.      INDICATION FOR PROCEDURE:  [name] is a 65 y.o. female patient of   mine.  The patient had been seen, evaluated, and treated for months conservatively in the   office with medication, activity modification, and injections.  The patient had   radiographic changes of bone-on-bone arthritis with endplate sclerosis and osteophytes noted.  Based on the radiographic changes and failed conservative measures, the patient   decided to proceed with definitive treatment, total knee replacement.  Risks of infection, DVT, component failure,  need for revision surgery, neurovascular injury were reviewed in the office setting.  The postop course was reviewed stressing the efforts to maximize post-operative satisfaction and function.  Consent was obtained for benefit of pain   relief.      PROCEDURE IN DETAIL:  The patient was brought to the operative theater.   Once adequate anesthesia, preoperative antibiotics, 2 gm of Ancef,1 gm of Tranexamic Acid, and 10 mg of Decadron administered, the patient was positioned supine with a right thigh tourniquet placed.  The  right lower extremity was prepped and draped in sterile fashion.  A time-   out was performed identifying the patient, planned procedure, and the appropriate extremity.      The right lower extremity was placed in the [facility] leg holder.  The leg was   exsanguinated, tourniquet elevated to 250 mmHg.  A midline incision was   made followed  by median parapatellar arthrotomy.  Following initial   exposure, attention was first directed to the patella.  Precut   measurement was noted to be 22 mm.  I resected down to 13 mm and used a   35 anatomic patellar button to restore patellar height as well as cover the cut surface.      The lug holes were drilled and a metal shim was placed to protect the   patella from retractors and saw blade during the procedure.      At this point, attention was now directed to the femur.  The femoral   canal was opened with a drill, irrigated to try to prevent fat emboli.  An   intramedullary rod was passed at 3 degrees valgus, 9 mm of bone was   resected off the distal femur.  Following this resection, the tibia was   subluxated anteriorly.  Using the extramedullary guide, 3 mm of bone was resected off   the proximal medial tibia.  We confirmed the gap would be   stable medially and laterally with a size 5 spacer block as well as confirmed that the tibial cut was perpendicular in the coronal plane, checking with an alignment rod.      Once this was  done, I sized the femur to be a size 4 in the anterior-   posterior dimension, chose a narrow component based on medial and   lateral dimension.  The size 4 rotation block was then pinned in   position anterior referenced using the C-clamp to set rotation.  The   anterior, posterior, and  chamfer cuts were made without difficulty nor   notching making certain that I was along the anterior cortex to help   with flexion gap stability.      The final box cut was made off the lateral aspect of distal femur.      At this point, the tibia was sized to be a size 3.  The size 3 tray was   then pinned in position through the medial third of the tubercle,   drilled, and keel punched.  Trial reduction was now carried with a 4 femur,  3 tibia, a size 6 mm PS insert, and the 35 anatomic patella botton.  The knee was brought to full extension with good flexion stability with the patella   tracking through the trochlea without application of pressure.  Given   all these findings the trial components removed.  Final components were   opened and cement was mixed.  The knee was irrigated with normal saline solution and pulse lavage.  The synovial lining was   then injected with 30 cc of 0.25% Marcaine with epinephrine, 1 cc of Toradol and 30 cc of NS for a total of 61 cc.     Final implants were then cemented onto cleaned and dried cut surfaces of bone with the knee brought to extension with a size 6 mm PS trial insert.      Once the cement had fully cured, excess cement was removed   throughout the knee.  I confirmed that I was satisfied with the range of   motion and stability, and the final size 6 mm PS AOX insert was chosen.  It was   placed into the knee.      The tourniquet had been let down at 29 minutes.  No significant   hemostasis was required.  The extensor mechanism was then reapproximated using #1 Vicryl and #1  Stratafix sutures with the knee   in flexion.  The   remaining wound was closed  with 2-0 Vicryl and running 4-0 Monocryl.   The knee was cleaned, dried, dressed sterilely using Dermabond and   Aquacel dressing.  The patient was then   brought to recovery room in stable condition, tolerating the procedure   well.   Please note that Physician Assistant, Dennie Bible, PA-C was present for the entirety of the case, and was utilized for pre-operative positioning, peri-operative retractor management, general facilitation of the procedure and for primary wound closure at the end of the case.              Madlyn Frankel Charlann Boxer, M.D.    07/13/2020 8:32 AM

## 2020-07-13 NOTE — Interval H&P Note (Signed)
History and Physical Interval Note:  07/13/2020 6:53 AM  Kathryn Hicks  has presented today for surgery, with the diagnosis of Right knee osteoarthtitis.  The various methods of treatment have been discussed with the patient and family. After consideration of risks, benefits and other options for treatment, the patient has consented to  Procedure(s) with comments: TOTAL KNEE ARTHROPLASTY (Right) - 70 mins as a surgical intervention.  The patient's history has been reviewed, patient examined, no change in status, stable for surgery.  I have reviewed the patient's chart and labs.  Questions were answered to the patient's satisfaction.     [name]

## 2020-07-13 NOTE — Evaluation (Signed)
Physical Therapy Evaluation Patient Details Name: Kathryn Hicks MRN: [id number] DOB: 02/13/1956 Today's Date: 07/13/2020   History of Present Illness  Pt is a 65 year old female s/p Rt TKA with PMHx of L TKA in March 2020  Clinical Impression  Pt is s/p TKA resulting in the deficits listed below (see PT Problem List).  Pt will benefit from skilled PT to increase their independence and safety with mobility to allow discharge to the venue listed below.  Pt assisted with ambulating in hallway POD #0.  Pt reports plan is to d/c home.  Pt requesting shower chair and grab bar for bathroom at home.      Follow Up Recommendations Follow surgeon's recommendation for DC plan and follow-up therapies    Equipment Recommendations  None recommended by PT    Recommendations for Other Services       Precautions / Restrictions Precautions Precautions: Fall;Knee Restrictions Weight Bearing Restrictions: No      Mobility  Bed Mobility Overal bed mobility: Needs Assistance Bed Mobility: Supine to Sit     Supine to sit: Supervision;HOB elevated          Transfers Overall transfer level: Needs assistance Equipment used: Rolling walker (2 wheeled) Transfers: Sit to/from Stand Sit to Stand: Min guard         General transfer comment: verbal cues for UE and LE positioning  Ambulation/Gait Ambulation/Gait assistance: Min guard Gait Distance (Feet): 100 Feet Assistive device: Rolling walker (2 wheeled) Gait Pattern/deviations: Step-to pattern;Decreased stance time - left;Antalgic     General Gait Details: verbal cues for sequence, RW positioning, step length; pt reports pain much better controlled compared to previous Lt TKA  Stairs            Wheelchair Mobility    Modified Rankin (Stroke Patients Only)       Balance                                             Pertinent Vitals/Pain Pain Assessment: 0-10 Pain Score: 4  Pain Location: right  calf Pain Descriptors / Indicators: Tightness Pain Intervention(s): Monitored during session;Repositioned    Home Living Family/patient expects to be discharged to:: Private residence Living Arrangements: Spouse/significant other   Type of Home: House Home Access: Stairs to enter Entrance Stairs-Rails: None Entrance Stairs-Number of Steps: 3 Home Layout: One level Home Equipment: Bedside commode;Walker - 2 wheels;Cane - single point Additional Comments: has gliders on the front of the standard walker to glide    Prior Function Level of Independence: Independent               Hand Dominance        Extremity/Trunk Assessment        Lower Extremity Assessment Lower Extremity Assessment: LLE deficits/detail LLE Deficits / Details: able to perform SLR and ankle pumps, observed at least 40* AROM knee flexion       Communication   Communication: No difficulties  Cognition Arousal/Alertness: Awake/alert Behavior During Therapy: WFL for tasks assessed/performed Overall Cognitive Status: Within Functional Limits for tasks assessed                                        General Comments      Exercises     Assessment/Plan  PT Assessment Patient needs continued PT services  PT Problem List Decreased strength;Decreased mobility;Decreased activity tolerance;Decreased knowledge of use of DME;Pain;Decreased range of motion       PT Treatment Interventions Stair training;Gait training;DME instruction;Therapeutic exercise;Balance training;Functional mobility training;Therapeutic activities;Patient/family education    PT Goals (Current goals can be found in the Care Plan section)  Acute Rehab PT Goals PT Goal Formulation: With patient Time For Goal Achievement: 07/17/20 Potential to Achieve Goals: Good    Frequency 7X/week   Barriers to discharge        Co-evaluation               AM-PAC PT "6 Clicks" Mobility  Outcome Measure Help  needed turning from your back to your side while in a flat bed without using bedrails?: A Little Help needed moving from lying on your back to sitting on the side of a flat bed without using bedrails?: A Little Help needed moving to and from a bed to a chair (including a wheelchair)?: A Little Help needed standing up from a chair using your arms (e.g., wheelchair or bedside chair)?: A Little Help needed to walk in hospital room?: A Lot Help needed climbing 3-5 steps with a railing? : A Lot 6 Click Score: 16    End of Session Equipment Utilized During Treatment: Gait belt Activity Tolerance: Patient tolerated treatment well Patient left: in chair;with call bell/phone within reach;with chair alarm set;with SCD's reapplied Nurse Communication: Mobility status PT Visit Diagnosis: Other abnormalities of gait and mobility (R26.89)    Time: 0100-7121 PT Time Calculation (min) (ACUTE ONLY): 20 min   Charges:   PT Evaluation $PT Eval Low Complexity: 1 Low        Kati PT, DPT Acute Rehabilitation Services Pager: [phone] Office: [phone]  [name] 07/13/2020, 3:48 PM

## 2020-07-13 NOTE — Transfer of Care (Signed)
Immediate Anesthesia Transfer of Care Note  Patient: Kathryn Hicks  Procedure(s) Performed: Procedure(s) with comments: TOTAL KNEE ARTHROPLASTY (Right) - 70 mins  Patient Location: PACU  Anesthesia Type:Spinal  Level of Consciousness:  sedated, patient cooperative and responds to stimulation  Airway & Oxygen Therapy:Patient Spontanous Breathing and Patient connected to face mask oxgen  Post-op Assessment:  Report given to PACU RN and Post -op Vital signs reviewed and stable  Post vital signs:  Reviewed and stable  Last Vitals:  Vitals:   07/13/20 0606  BP: 125/87  Pulse: 72  Resp: 14  Temp: 36.8 C  SpO2: 98%    Complications: No apparent anesthesia complications

## 2020-07-13 NOTE — Anesthesia Procedure Notes (Signed)
Spinal  Patient location during procedure: OR Start time: 07/13/2020 7:15 AM End time: 07/13/2020 7:20 AM Reason for block: surgical anesthesia Staffing Anesthesiologist: Bethena Midget, MD Preanesthetic Checklist Completed: patient identified, IV checked, site marked, risks and benefits discussed, surgical consent, monitors and equipment checked, pre-op evaluation and timeout performed Spinal Block Patient position: sitting Prep: DuraPrep Patient monitoring: heart rate, cardiac monitor, continuous pulse ox and blood pressure Approach: midline Location: L3-4 Injection technique: single-shot Needle Needle type: Sprotte  Needle gauge: 24 G Needle length: 9 cm Assessment Sensory level: T4 Events: CSF return

## 2020-07-13 NOTE — Anesthesia Postprocedure Evaluation (Signed)
Anesthesia Post Note  Patient: Gem Conkle  Procedure(s) Performed: TOTAL KNEE ARTHROPLASTY (Right Knee)     Patient location during evaluation: PACU Anesthesia Type: Spinal and MAC Level of consciousness: oriented and awake and alert Pain management: pain level controlled Vital Signs Assessment: post-procedure vital signs reviewed and stable Respiratory status: spontaneous breathing, respiratory function stable and patient connected to nasal cannula oxygen Cardiovascular status: blood pressure returned to baseline and stable Postop Assessment: no headache, no backache and no apparent nausea or vomiting Anesthetic complications: no   No complications documented.  Last Vitals:  Vitals:   07/13/20 1000 07/13/20 1022  BP: 127/70 125/66  Pulse: 76 75  Resp: 15 17  Temp: (!) 36.4 C (!) 36.4 C  SpO2: 97% 95%    Last Pain:  Vitals:   07/13/20 1030  TempSrc:   PainSc: 0-No pain                 Rosamaria Donn

## 2020-07-14 ENCOUNTER — Encounter (HOSPITAL_COMMUNITY): Payer: Self-pay | Admitting: Orthopedic Surgery

## 2020-07-14 DIAGNOSIS — M1711 Unilateral primary osteoarthritis, right knee: Secondary | ICD-10-CM | POA: Diagnosis not present

## 2020-07-14 LAB — BASIC METABOLIC PANEL
Anion gap: 9 (ref 5–15)
BUN: 15 mg/dL (ref 8–23)
CO2: 26 mmol/L (ref 22–32)
Calcium: 9 mg/dL (ref 8.9–10.3)
Chloride: 100 mmol/L (ref 98–111)
Creatinine, Ser: 0.81 mg/dL (ref 0.44–1.00)
GFR, Estimated: >60 mL/min (ref >60)
Glucose, Bld: 145 mg/dL — ABNORMAL HIGH (ref 70–99)
Potassium: 4 mmol/L (ref 3.5–5.1)
Sodium: 135 mmol/L (ref 135–145)

## 2020-07-14 LAB — CBC
HCT: 34.5 % — ABNORMAL LOW (ref 36.0–46.0)
Hemoglobin: 11.6 g/dL — ABNORMAL LOW (ref 12.0–15.0)
MCH: 32.7 pg (ref 26.0–34.0)
MCHC: 33.6 g/dL (ref 30.0–36.0)
MCV: 97.2 fL (ref 80.0–100.0)
Platelets: 132 10*3/uL — ABNORMAL LOW (ref 150–400)
RBC: 3.55 MIL/uL — ABNORMAL LOW (ref 3.87–5.11)
RDW: 12.2 % (ref 11.5–15.5)
WBC: 11.6 10*3/uL — ABNORMAL HIGH (ref 4.0–10.5)
nRBC: 0 % (ref 0.0–0.2)

## 2020-07-14 MED ORDER — DOCUSATE SODIUM 100 MG PO CAPS: 100.0000 mg | ORAL_CAPSULE | Freq: Two times a day (BID) | ORAL | 0 refills | Status: DC

## 2020-07-14 MED ORDER — PROMETHAZINE HCL 25 MG PO TABS
12.5000 mg | ORAL_TABLET | Freq: Four times a day (QID) | ORAL | Status: DC | PRN
  Administered 2020-07-14: 12.5 mg via ORAL
  Filled 2020-07-14: qty 1

## 2020-07-14 MED ORDER — HYDROMORPHONE HCL 2 MG PO TABS
2.0000 mg | ORAL_TABLET | ORAL | Status: DC | PRN
  Administered 2020-07-14 (×3): 2 mg via ORAL
  Filled 2020-07-14 (×3): qty 1

## 2020-07-14 MED ORDER — OXYCODONE HCL 5 MG PO TABS: 5.0000 mg | ORAL_TABLET | ORAL | 0 refills | Status: DC | PRN

## 2020-07-14 MED ORDER — METHOCARBAMOL 500 MG PO TABS: 500.0000 mg | ORAL_TABLET | Freq: Four times a day (QID) | ORAL | 0 refills | Status: DC | PRN

## 2020-07-14 MED ORDER — PROMETHAZINE HCL 12.5 MG PO TABS: 25.0000 mg | ORAL_TABLET | Freq: Four times a day (QID) | ORAL | 0 refills | Status: DC | PRN

## 2020-07-14 MED ORDER — ASPIRIN 81 MG PO CHEW: 81.0000 mg | CHEWABLE_TABLET | Freq: Two times a day (BID) | ORAL | 0 refills | Status: AC

## 2020-07-14 MED ORDER — POLYETHYLENE GLYCOL 3350 17 G PO PACK: 17.0000 g | PACK | Freq: Every day | ORAL | 0 refills | Status: DC | PRN

## 2020-07-14 MED ORDER — PROMETHAZINE HCL 25 MG PO TABS: 25.0000 mg | ORAL_TABLET | Freq: Four times a day (QID) | ORAL | Status: DC | PRN

## 2020-07-14 NOTE — Plan of Care (Signed)
  Problem: Health Behavior/Discharge Planning: Goal: Ability to manage health-related needs will improve Outcome: Progressing   Problem: Activity: Goal: Risk for activity intolerance will decrease Outcome: Progressing   Problem: Nutrition: Goal: Adequate nutrition will be maintained Outcome: Progressing   Problem: Pain Managment: Goal: General experience of comfort will improve Outcome: Progressing   

## 2020-07-14 NOTE — TOC Transition Note (Signed)
Transition of Care [facility]) - CM/SW Discharge Note   Patient Details  Name: Kathryn Hicks MRN: [id number] Date of Birth: [birth year]  [facility] CM/SW Contact:  Lennart Pall, LCSW Phone Number: 07/14/2020, 1:59 PM   Clinical Narrative:    Met briefly with pt and spouse and confirming she has all needed DME at home.  Plan for OPPT via [facility] in [place].  No further TOC needs.   Final next level of care: OP Rehab Barriers to Discharge: No Barriers Identified   Patient Goals and CMS Choice Patient states their goals for this hospitalization and ongoing recovery are:: return home      Discharge Placement                       Discharge Plan and Services                DME Arranged: N/A DME Agency: NA                  Social Determinants of Health (SDOH) Interventions     Readmission Risk Interventions No flowsheet data found.

## 2020-07-14 NOTE — Progress Notes (Signed)
Subjective: 1 Day Post-Op Procedure(s) (LRB): TOTAL KNEE ARTHROPLASTY (Right) Patient reports pain as mild.   Patient seen in rounds for Dr. Charlann Boxer. Patient is well, and has had no acute complaints or problems. No acute events overnight. Foley catheter removed. Patient ambulated 100 feet with PT. Patient resting in bed on exam today, but became nauseated and vomited. She reports she vomits frequently related to motion sickness and migraines and does get relief with phenergan although it makes her sedated.  We will continue therapy today.   Objective: Vital signs in last 24 hours: Temp:  [97.4 F (36.3 C)-98.2 F (36.8 C)] 97.6 F (36.4 C) (04/13 0601) Pulse Rate:  [65-84] 71 (04/13 0601) Resp:  [10-17] 16 (04/13 0601) BP: (82-136)/(52-76) 136/76 (04/13 0601) SpO2:  [94 %-99 %] 97 % (04/13 0601)  Intake/Output from previous day:  Intake/Output Summary (Last 24 hours) at 07/14/2020 0851 Last data filed at 07/14/2020 0635 Gross per 24 hour  Intake 2474.86 ml  Output 2600 ml  Net -125.14 ml     Intake/Output this shift: No intake/output data recorded.  Labs: Recent Labs    07/14/20 0321  HGB 11.6*   Recent Labs    07/14/20 0321  WBC 11.6*  RBC 3.55*  HCT 34.5*  PLT 132*   Recent Labs    07/14/20 0321  NA 135  K 4.0  CL 100  CO2 26  BUN 15  CREATININE 0.81  GLUCOSE 145*  CALCIUM 9.0   No results for input(s): LABPT, INR in the last 72 hours.  Exam: General - Patient is Alert and Oriented Extremity - Neurologically intact Sensation intact distally Intact pulses distally Dorsiflexion/Plantar flexion intact Dressing - dressing C/D/I Motor Function - intact, moving foot and toes well on exam.   Past Medical History:  Diagnosis Date  . Anxiety   . Depression   . GERD (gastroesophageal reflux disease)   . Hypertension   . Hypothyroidism    followed by pcp  . Migraines   . OA (osteoarthritis)    knees  . Pneumonia    hx of   . PONV (postoperative  nausea and vomiting)    woke up during knee surgery   . Wears glasses     Assessment/Plan: 1 Day Post-Op Procedure(s) (LRB): TOTAL KNEE ARTHROPLASTY (Right) Active Problems:   S/P total knee arthroplasty, right  Estimated body mass index is 31.86 kg/m as calculated from the following:   Height as of this encounter: 5' 2.5" (1.588 m).   Weight as of this encounter: 80.3 kg. Advance diet Up with therapy   Patient's anticipated LOS is less than 2 midnights, meeting these requirements: - Younger than 31 - Lives within 1 hour of care - Has a competent adult at home to recover with post-op recover - NO history of  - Chronic pain requiring opiods  - Diabetes  - Coronary Artery Disease  - Heart failure  - Heart attack  - Stroke  - DVT/VTE  - Cardiac arrhythmia  - Respiratory Failure/COPD  - Renal failure  - Anemia  - Advanced Liver disease  DVT Prophylaxis - Aspirin Weight bearing as tolerated.  Hemoglobin stable at 11.6 this morning.   Plan is to go Home after hospital stay. We discussed switching her pain medication to Dilaudid to see if this provides any nausea relief as compared to Oxycodone. She did have vomiting associated with her IV dilaudid, but wants to try this. We will send meds to the pharmacy pending how this  does for her. We will also send phenergan home for her to use as needed.    Plan for discharge today following 1-2 sessions of PT as long as they are meeting their goals. Patient is scheduled for OPPT. Follow up in the office in 2 weeks.   Dennie Bible, PA-C Orthopedic Surgery [phone] 07/14/2020, 8:51 AM

## 2020-07-14 NOTE — Progress Notes (Signed)
Physical Therapy Treatment Patient Details Name: Kathryn Hicks MRN: [id number] DOB: [birth year] Today's Date: 07/14/2020    History of Present Illness Pt is a 65 year old female s/p Rt TKA with PMHx of L TKA in March 2020    PT Comments    Pt ambulated in hallway and practiced safe stair technique with spouse assisting holding RW.  Pt and spouse reports understanding and provided with stair handout and HEP.  Pt reviewed HEP and therapist answered pt's questions regarding exercises.  Pt declined performing exercises (familiar from previous left TKA) and feels ready for d/c home today.  Pt plans to f/u with OPPT on Monday.   Follow Up Recommendations  Follow surgeon's recommendation for DC plan and follow-up therapies     Equipment Recommendations  None recommended by PT    Recommendations for Other Services       Precautions / Restrictions Precautions Precautions: Fall;Knee Restrictions Weight Bearing Restrictions: No    Mobility  Bed Mobility Overal bed mobility: Needs Assistance Bed Mobility: Supine to Sit     Supine to sit: Supervision;HOB elevated          Transfers Overall transfer level: Needs assistance Equipment used: Rolling walker (2 wheeled) Transfers: Sit to/from Stand Sit to Stand: Min guard         General transfer comment: verbal cues for UE and LE positioning  Ambulation/Gait Ambulation/Gait assistance: Min guard Gait Distance (Feet): 80 Feet Assistive device: Rolling walker (2 wheeled) Gait Pattern/deviations: Step-to pattern;Decreased stance time - left;Antalgic     General Gait Details: verbal cues for sequence, RW positioning, step length; pt limited distance (wants to d/c home asap)   Stairs Stairs: Yes Stairs assistance: Min guard Stair Management: Step to pattern;Backwards;With walker Number of Stairs: 2 General stair comments: verbal cues for sequence, RW positioning, safety; spouse present and assisted with RW; performed  twice, provided handout   Wheelchair Mobility    Modified Rankin (Stroke Patients Only)       Balance                                            Cognition Arousal/Alertness: Awake/alert Behavior During Therapy: WFL for tasks assessed/performed Overall Cognitive Status: Within Functional Limits for tasks assessed                                        Exercises      General Comments        Pertinent Vitals/Pain Pain Assessment: 0-10 Pain Score: 5  Pain Location: right knee Pain Descriptors / Indicators: Tightness;Sore;Aching Pain Intervention(s): Repositioned;Premedicated before session;Monitored during session    Home Living                      Prior Function            PT Goals (current goals can now be found in the care plan section) Progress towards PT goals: Progressing toward goals    Frequency    7X/week      PT Plan Current plan remains appropriate    Co-evaluation              AM-PAC PT "6 Clicks" Mobility   Outcome Measure  Help needed turning from your back to your side while in  a flat bed without using bedrails?: A Little Help needed moving from lying on your back to sitting on the side of a flat bed without using bedrails?: A Little Help needed moving to and from a bed to a chair (including a wheelchair)?: A Little Help needed standing up from a chair using your arms (e.g., wheelchair or bedside chair)?: A Little Help needed to walk in hospital room?: A Little Help needed climbing 3-5 steps with a railing? : A Little 6 Click Score: 18    End of Session Equipment Utilized During Treatment: Gait belt Activity Tolerance: Patient tolerated treatment well Patient left: in chair;with call bell/phone within reach;with family/visitor present Nurse Communication: Mobility status PT Visit Diagnosis: Other abnormalities of gait and mobility (R26.89)     Time: 3662-9476 PT Time Calculation  (min) (ACUTE ONLY): 18 min  Charges:  $Gait Training: 8-22 mins                    Paulino Door, DPT Acute Rehabilitation Services Pager: [phone] Office: [phone]  Maida Sale E 07/14/2020, 11:57 AM

## 2020-07-14 NOTE — Plan of Care (Signed)
Pt discharged, all care plans completed

## 2020-07-19 NOTE — Discharge Summary (Signed)
Physician Discharge Summary   Patient ID: Kathryn Hicks MRN: [id number] DOB/AGE: [birth year] 65 y.o.  Admit date: 07/13/2020 Discharge date: 07/14/2020  Primary Diagnosis: Right knee osteoarthritis.  Admission Diagnoses:  Past Medical History:  Diagnosis Date  . Anxiety   . Depression   . GERD (gastroesophageal reflux disease)   . Hypertension   . Hypothyroidism    followed by pcp  . Migraines   . OA (osteoarthritis)    knees  . Pneumonia    hx of   . PONV (postoperative nausea and vomiting)    woke up during knee surgery   . Wears glasses    Discharge Diagnoses:   Active Problems:   S/P total knee arthroplasty, right  Estimated body mass index is 31.86 kg/m as calculated from the following:   Height as of this encounter: 5' 2.5" (1.588 m).   Weight as of this encounter: 80.3 kg.  Procedure:  Procedure(s) (LRB): TOTAL KNEE ARTHROPLASTY (Right)   Consults: None  HPI: Kathryn Hicks is a 65 y.o. female patient of   mine.  The patient had been seen, evaluated, and treated for months conservatively in the   office with medication, activity modification, and injections.  The patient had   radiographic changes of bone-on-bone arthritis with endplate sclerosis and osteophytes noted.  Based on the radiographic changes and failed conservative measures, the patient   decided to proceed with definitive treatment, total knee replacement.  Risks of infection, DVT, component failure, need for revision surgery, neurovascular injury were reviewed in the office setting.  The postop course was reviewed stressing the efforts to maximize post-operative satisfaction and function.  Consent was obtained for benefit of pain   relief.   Laboratory Data: Admission on 07/13/2020, Discharged on 07/14/2020  Component Date Value Ref Range Status  . WBC 07/14/2020 11.6* 4.0 - 10.5 K/uL Final  . RBC 07/14/2020 3.55* 3.87 - 5.11 MIL/uL Final  . Hemoglobin 07/14/2020 11.6* 12.0 - 15.0 g/dL Final   . HCT 74/11/1446 34.5* 36.0 - 46.0 % Final  . MCV 07/14/2020 97.2  80.0 - 100.0 fL Final  . MCH 07/14/2020 32.7  26.0 - 34.0 pg Final  . MCHC 07/14/2020 33.6  30.0 - 36.0 g/dL Final  . RDW 18/56/3149 12.2  11.5 - 15.5 % Final  . Platelets 07/14/2020 132* 150 - 400 K/uL Final   Comment: Immature Platelet Fraction may be clinically indicated, consider ordering this additional test [id number]   . nRBC 07/14/2020 0.0  0.0 - 0.2 % Final   Performed at [facility], [address] [place], [place] [address]  . Sodium 07/14/2020 135  135 - 145 mmol/L Final  . Potassium 07/14/2020 4.0  3.5 - 5.1 mmol/L Final  . Chloride 07/14/2020 100  98 - 111 mmol/L Final  . CO2 07/14/2020 26  22 - 32 mmol/L Final  . Glucose, Bld 07/14/2020 145* 70 - 99 mg/dL Final   Glucose reference range applies only to samples taken after fasting for at least 8 hours.  . BUN 07/14/2020 15  8 - 23 mg/dL Final  . Creatinine, Ser 07/14/2020 0.81  0.44 - 1.00 mg/dL Final  . Calcium 77/41/2878 9.0  8.9 - 10.3 mg/dL Final  . GFR, Estimated 07/14/2020 >60  >60 mL/min Final   Comment: (NOTE) Calculated using the CKD-EPI Creatinine Equation (2021)   . Anion gap 07/14/2020 9  5 - 15 Final   Performed at [facility], [address], [place], [place] [address]  Hospital Outpatient Visit on 07/09/2020  Component Date Value Ref Range Status  . SARS Coronavirus 2 07/09/2020 NEGATIVE  NEGATIVE Final   Comment: (NOTE) SARS-CoV-2 target nucleic acids are NOT DETECTED.  The SARS-CoV-2 RNA is generally detectable in upper and lower respiratory specimens during the acute phase of infection. Negative results do not preclude SARS-CoV-2 infection, do not rule out co-infections with other pathogens, and should not be used as the sole basis for treatment or other patient management decisions. Negative results must be combined with clinical observations, patient history, and epidemiological  information. The expected result is Negative.  Fact Sheet for Patients: HairSlick.no  Fact Sheet for Healthcare Providers: quierodirigir.com  This test is not yet approved or cleared by the [place] FDA and  has been authorized for detection and/or diagnosis of SARS-CoV-2 by FDA under an Emergency Use Authorization (EUA). This EUA will remain  in effect (meaning this test can be used) for the duration of the COVID-19 declaration under Se                          ction 564(b)(1) of the Act, 21 U.S.C. section 360bbb-3(b)(1), unless the authorization is terminated or revoked sooner.  Performed at [facility], [address], [place], [place] [address]   Hospital Outpatient Visit on 07/02/2020  Component Date Value Ref Range Status  . WBC 07/02/2020 7.0  4.0 - 10.5 K/uL Final  . RBC 07/02/2020 4.10  3.87 - 5.11 MIL/uL Final  . Hemoglobin 07/02/2020 13.5  12.0 - 15.0 g/dL Final  . HCT 84/13/2440 40.6  36.0 - 46.0 % Final  . MCV 07/02/2020 99.0  80.0 - 100.0 fL Final  . MCH 07/02/2020 32.9  26.0 - 34.0 pg Final  . MCHC 07/02/2020 33.3  30.0 - 36.0 g/dL Final  . RDW 01/28/2535 12.5  11.5 - 15.5 % Final  . Platelets 07/02/2020 157  150 - 400 K/uL Final  . nRBC 07/02/2020 0.0  0.0 - 0.2 % Final   Performed at [facility], [address], [place], [place] [address]  . Sodium 07/02/2020 137  135 - 145 mmol/L Final  . Potassium 07/02/2020 3.5  3.5 - 5.1 mmol/L Final  . Chloride 07/02/2020 104  98 - 111 mmol/L Final  . CO2 07/02/2020 24  22 - 32 mmol/L Final  . Glucose, Bld 07/02/2020 167* 70 - 99 mg/dL Final   Glucose reference range applies only to samples taken after fasting for at least 8 hours.  . BUN 07/02/2020 14  8 - 23 mg/dL Final  . Creatinine, Ser 07/02/2020 0.78  0.44 - 1.00 mg/dL Final  . Calcium 47/42/5956 9.5  8.9 - 10.3 mg/dL Final  . Total Protein 07/02/2020 7.1  6.5 - 8.1 g/dL Final   . Albumin 38/75/6433 4.1  3.5 - 5.0 g/dL Final  . AST 29/51/8841 74* 15 - 41 U/L Final  . ALT 07/02/2020 63* 0 - 44 U/L Final  . Alkaline Phosphatase 07/02/2020 62  38 - 126 U/L Final  . Total Bilirubin 07/02/2020 0.5  0.3 - 1.2 mg/dL Final  . GFR, Estimated 07/02/2020 >60  >60 mL/min Final   Comment: (NOTE) Calculated using the CKD-EPI Creatinine Equation (2021)   . Anion gap 07/02/2020 9  5 - 15 Final   Performed at [facility], [address], [place], [place] [address]  . Prothrombin Time 07/02/2020 13.1  11.4 - 15.2 seconds Final  .  INR 07/02/2020 1.0  0.8 - 1.2 Final   Comment: (NOTE) INR goal varies based on device and disease states. Performed at [facility], [address] [place], [place] [address]   . aPTT 07/02/2020 35  24 - 36 seconds Final   Performed at [facility], [address] [place], [place] [address]  . ABO/RH(D) 07/02/2020 A POS   Final  . Antibody Screen 07/02/2020 NEG   Final  . Sample Expiration 07/02/2020 07/16/2020,2359   Final  . Extend sample reason 07/02/2020    Final                   Value:NO TRANSFUSIONS OR PREGNANCY IN THE PAST 3 MONTHS Performed at [facility], [address], [place], [place] [address]   . MRSA, PCR 07/02/2020 NEGATIVE  NEGATIVE Final  . Staphylococcus aureus 07/02/2020 NEGATIVE  NEGATIVE Final   Comment: (NOTE) The Xpert SA Assay (FDA approved for NASAL specimens in patients 38 years of age and older), is one component of a comprehensive surveillance program. It is not intended to diagnose infection nor to guide or monitor treatment. Performed at [facility], [address] [place], [place] [address]      X-Rays:No results found.  EKG: Orders placed or performed during the hospital encounter of 07/02/20  . EKG 12 lead per protocol  . EKG 12 lead per protocol     Hospital Course: Kathryn Hicks is a 65 y.o. who was  admitted to [facility]. They were brought to the operating room on 07/13/2020 and underwent Procedure(s): TOTAL KNEE ARTHROPLASTY.  Patient tolerated the procedure well and was later transferred to the recovery room and then to the orthopaedic floor for postoperative care. They were given PO and IV analgesics for pain control following their surgery. They were given 24 hours of postoperative antibiotics of  Anti-infectives (From admission, onward)   Start     Dose/Rate Route Frequency Ordered Stop   07/13/20 1400  ceFAZolin (ANCEF) IVPB 2g/100 mL premix        2 g 200 mL/hr over 30 Minutes Intravenous Every 6 hours 07/13/20 1015 07/13/20 2034   07/13/20 0600  ceFAZolin (ANCEF) IVPB 2g/100 mL premix        2 g 200 mL/hr over 30 Minutes Intravenous On call to O.R. 07/13/20 6578 07/13/20 0721     and started on DVT prophylaxis in the form of Aspirin.   PT and OT were ordered for total joint protocol. Discharge planning consulted to help with postop disposition and equipment needs.  Patient had a good night on the evening of surgery. They started to get up OOB with therapy on POD #0. Pt was seen during rounds and was ready to go home pending progress with therapy. She was having nausea/vomiting, which she typically has with pain medication. She had good relief with phenergan in the past, which we sent home with her. She worked with therapy on POD #1 and was meeting her goals. Pt was discharged to home later that day in stable condition.  Diet: Regular diet Activity: WBAT Follow-up: in 2 weeks Disposition: Home Discharged Condition: good   Discharge Instructions    Call MD / Call 911   Complete by: As directed    If you experience chest pain or shortness of breath, CALL 911 and be transported to the hospital emergency room.  If you develope a fever above 101 F, pus (white drainage) or  increased drainage or redness at the wound, or calf pain, call your surgeon's office.   Change dressing    Complete by: As directed    Maintain surgical dressing until follow up in the clinic. If the edges start to pull up, may reinforce with tape. If the dressing is no longer working, may remove and cover with gauze and tape, but must keep the area dry and clean.  Call with any questions or concerns.   Constipation Prevention   Complete by: As directed    Drink plenty of fluids.  Prune juice may be helpful.  You may use a stool softener, such as Colace (over the counter) 100 mg twice a day.  Use MiraLax (over the counter) for constipation as needed.   Diet - low sodium heart healthy   Complete by: As directed    Discharge instructions   Complete by: As directed    Maintain surgical dressing until follow up in the clinic. If the edges start to pull up, may reinforce with tape. If the dressing is no longer working, may remove and cover with gauze and tape, but must keep the area dry and clean.  Follow up in 2 weeks at [facility]. Call with any questions or concerns.   Increase activity slowly as tolerated   Complete by: As directed    Weight bearing as tolerated with assist device (walker, cane, etc) as directed, use it as long as suggested by your surgeon or therapist, typically at least 4-6 weeks.   TED hose   Complete by: As directed    Use stockings (TED hose) for 2 weeks on both leg(s).  You may remove them at night for sleeping.     Allergies as of 07/14/2020      Reactions   Butalbital Hives   Other reaction(s): Other (See Comments) unknown   Sporanos [itraconazole] Hives, Rash   Welts full body      Medication List    STOP taking these medications   ibuprofen 200 MG tablet Commonly known as: ADVIL     TAKE these medications   almotriptan 12.5 MG tablet Commonly known as: AXERT Take 12.5 mg by mouth as needed for migraine. may repeat in 2 hours if needed   ALPRAZolam 1 MG tablet Commonly known as: XANAX Take 1 mg by mouth at bedtime.   amLODipine 2.5 MG tablet Commonly  known as: NORVASC Take 2.5 mg by mouth daily.   aspirin 81 MG chewable tablet Chew 1 tablet (81 mg total) by mouth 2 (two) times daily for 28 days.   Biotin 5000 MCG Tabs Take 5,000 mcg by mouth daily.   cholecalciferol 25 MCG (1000 UNIT) tablet Commonly known as: VITAMIN D3 Take 1,000 Units by mouth daily.   diphenhydrAMINE 25 MG tablet Commonly known as: BENADRYL Take 25 mg by mouth daily as needed for allergies.   docusate sodium 100 MG capsule Commonly known as: COLACE Take 1 capsule (100 mg total) by mouth 2 (two) times daily.   esomeprazole 20 MG capsule Commonly known as: NEXIUM Take 20 mg by mouth daily as needed.   estrogens (conjugated) 0.9 MG tablet Commonly known as: PREMARIN Take 0.9 mg by mouth daily.   lamoTRIgine 150 MG tablet Commonly known as: LAMICTAL Take 150 mg by mouth 2 (two) times daily.   levothyroxine 25 MCG tablet Commonly known as: SYNTHROID Take 25 mcg by mouth daily before breakfast.   Lumify 0.025 % Soln Generic drug: Brimonidine Tartrate Place 1 drop into  both eyes daily as needed (dry eyes).   methocarbamol 500 MG tablet Commonly known as: ROBAXIN Take 1 tablet (500 mg total) by mouth every 6 (six) hours as needed for muscle spasms.   olmesartan-hydrochlorothiazide 40-25 MG tablet Commonly known as: BENICAR HCT Take 1 tablet by mouth daily.   olopatadine 0.1 % ophthalmic solution Commonly known as: PATANOL Place 1 drop into both eyes 2 (two) times daily as needed for allergies.   OSTEO BI-FLEX TRIPLE STRENGTH PO Take 1 tablet by mouth daily.   oxyCODONE 5 MG immediate release tablet Commonly known as: Roxicodone Take 1-2 tablets (5-10 mg total) by mouth every 4 (four) hours as needed.   polyethylene glycol 17 g packet Commonly known as: MIRALAX / GLYCOLAX Take 17 g by mouth daily as needed for mild constipation.   PROBIOTIC DAILY PO Take 1 capsule by mouth daily.   promethazine 12.5 MG tablet Commonly known as:  PHENERGAN Take 2 tablets (25 mg total) by mouth every 6 (six) hours as needed for nausea, vomiting or refractory nausea / vomiting.            Discharge Care Instructions  (From admission, onward)         Start     Ordered   07/14/20 0000  Change dressing       Comments: Maintain surgical dressing until follow up in the clinic. If the edges start to pull up, may reinforce with tape. If the dressing is no longer working, may remove and cover with gauze and tape, but must keep the area dry and clean.  Call with any questions or concerns.   07/14/20 1404          Follow-up Information    Durene Romanslin, Matthew, MD. Schedule an appointment as soon as possible for a visit in 2 weeks.   Specialty: Orthopedic Surgery Contact information: [address] [place] [id number] [phone]               Signed: Dennie Bibleshley Bari Leib, PA-C Orthopedic Surgery 07/19/2020, 3:17 PM

## 2020-12-17 ENCOUNTER — Ambulatory Visit (INDEPENDENT_AMBULATORY_CARE_PROVIDER_SITE_OTHER): Payer: Medicare Other | Admitting: Student

## 2020-12-17 ENCOUNTER — Telehealth (HOSPITAL_BASED_OUTPATIENT_CLINIC_OR_DEPARTMENT_OTHER): Payer: Self-pay

## 2020-12-17 ENCOUNTER — Other Ambulatory Visit: Payer: Self-pay

## 2020-12-17 ENCOUNTER — Encounter: Payer: Self-pay | Admitting: Student

## 2020-12-17 VITALS — BP 112/68 | HR 76 | Temp 97.8°F | Ht 62.5 in | Wt 178.4 lb

## 2020-12-17 DIAGNOSIS — R059 Cough, unspecified: Secondary | ICD-10-CM | POA: Diagnosis not present

## 2020-12-17 DIAGNOSIS — R918 Other nonspecific abnormal finding of lung field: Secondary | ICD-10-CM | POA: Diagnosis not present

## 2020-12-17 NOTE — Progress Notes (Signed)
Synopsis: Referred for COPD with exacerbation by Marylen Ponto, MD  Subjective:   PATIENT ID: Kathryn Hicks GENDER: female DOB: 12/01/1955, MRN: [id number]  Chief Complaint  Patient presents with   Consult    Referred for COPD, states bronchitis, walking pna for last 4 months, DOE, productive cough, yellow sputum, wheezes, crackles at times    She has history of COPD, GERD, hypothyroid and is referred for COPD with exacerbation.   4 months ago with severe cough, especially over last 6 weeks. Has been to urgent care x3, ED x1. Feels better when she gets steroids. It is productive cough. No fever throughout. 3 rounds of ABX (clindamcyin and a couple of others). Did take fluconazole as well. Only feels dyspneic when she is coughing. Only has chest pain when she's been coughing.    Otherwise pertinent review of systems is negative.  Smoked in past - only 15y, quit 40ish years ago, 1 pack per week. She worked in [employer] She lived in [place] for a bit, [place], [employer].THey have 3 teacup poodles. Used to have a small parrot got rid of it 8-10 years ago. No hot tub  No family history of lung disease   Past Medical History:  Diagnosis Date   Anxiety    Depression    GERD (gastroesophageal reflux disease)    Hypertension    Hypothyroidism    followed by pcp   Migraines    OA (osteoarthritis)    knees   Pneumonia    hx of    PONV (postoperative nausea and vomiting)    woke up during knee surgery    Wears glasses      Family History  Problem Relation Age of Onset   Prostate cancer Father    Heart attack Father    Wilm's tumor Daughter      Past Surgical History:  Procedure Laterality Date   ABDOMINAL HYSTERECTOMY  2003    w/  Bilateral Salpingo-oophorectomy   CARPAL TUNNEL RELEASE Left 2014   CESAREAN SECTION  x2  last one 1981   COLONOSCOPY     KNEE ARTHROSCOPY Left 06-21-2017   dr Darrelyn Hillock   LAPAROSCOPIC CHOLECYSTECTOMY  2004   TOTAL KNEE ARTHROPLASTY  Left 06/11/2018   Procedure: TOTAL KNEE ARTHROPLASTY;  Surgeon: [name], MD;  Location: [facility];  Service: Orthopedics;  Laterality: Left;    TOTAL KNEE ARTHROPLASTY Right 07/13/2020   Procedure: TOTAL KNEE ARTHROPLASTY;  Surgeon: Durene Romans, MD;  Location: [facility];  Service: Orthopedics;  Laterality: Right;  70 mins    Social History   Socioeconomic History   Marital status: Married    Spouse name: Not on file   Number of children: Not on file   Years of education: Not on file   Highest education level: Not on file  Occupational History   Not on file  Tobacco Use   Smoking status: Former    Years: 10.00    Types: Cigarettes    Quit date: 05/22/1977    Years since quitting: 43.6   Smokeless tobacco: Never  Vaping Use   Vaping Use: Never used  Substance and Sexual Activity   Alcohol use: Never    Comment: rare   Drug use: Never   Sexual activity: Not on file  Other Topics Concern   Not on file  Social History Narrative   Not on file   Social Determinants of Health   Financial Resource Strain: Not on file  Food Insecurity:  Not on file  Transportation Needs: Not on file  Physical Activity: Not on file  Stress: Not on file  Social Connections: Not on file  Intimate Partner Violence: Not on file     Allergies  Allergen Reactions   Butalbital Hives    Other reaction(s): Other (See Comments) unknown   Sporanos [Itraconazole] Hives and Rash    Welts full body     Outpatient Medications Prior to Visit  Medication Sig Dispense Refill   albuterol (VENTOLIN HFA) 108 (90 Base) MCG/ACT inhaler SMARTSIG:2 Puff(s) By Mouth Every 4-6 Hours PRN     almotriptan (AXERT) 12.5 MG tablet Take 12.5 mg by mouth as needed for migraine. may repeat in 2 hours if needed     ALPRAZolam (XANAX) 1 MG tablet Take 1 mg by mouth at bedtime.     amLODipine (NORVASC) 2.5 MG tablet Take 2.5 mg by mouth daily.     Biotin 5000 MCG TABS Take 5,000 mcg by mouth daily.       Brimonidine Tartrate (LUMIFY) 0.025 % SOLN Place 1 drop into both eyes daily as needed (dry eyes).     cholecalciferol (VITAMIN D3) 25 MCG (1000 UT) tablet Take 1,000 Units by mouth daily.     diphenhydrAMINE (BENADRYL) 25 MG tablet Take 25 mg by mouth daily as needed for allergies.     esomeprazole (NEXIUM) 20 MG capsule Take 20 mg by mouth daily as needed.      estrogens, conjugated, (PREMARIN) 0.9 MG tablet Take 0.9 mg by mouth daily.      ipratropium (ATROVENT HFA) 17 MCG/ACT inhaler Inhale 2 puffs into the lungs every 6 (six) hours.     lamoTRIgine (LAMICTAL) 150 MG tablet Take 150 mg by mouth 2 (two) times daily.     levothyroxine (SYNTHROID, LEVOTHROID) 25 MCG tablet Take 25 mcg by mouth daily before breakfast.     Misc Natural Products (OSTEO BI-FLEX TRIPLE STRENGTH PO) Take 1 tablet by mouth daily.      olmesartan-hydrochlorothiazide (BENICAR HCT) 40-25 MG tablet Take 1 tablet by mouth daily.     methocarbamol (ROBAXIN) 500 MG tablet Take 1 tablet (500 mg total) by mouth every 6 (six) hours as needed for muscle spasms. 40 tablet 0   docusate sodium (COLACE) 100 MG capsule Take 1 capsule (100 mg total) by mouth 2 (two) times daily. 10 capsule 0   olopatadine (PATANOL) 0.1 % ophthalmic solution Place 1 drop into both eyes 2 (two) times daily as needed for allergies.     oxyCODONE (ROXICODONE) 5 MG immediate release tablet Take 1-2 tablets (5-10 mg total) by mouth every 4 (four) hours as needed. 56 tablet 0   polyethylene glycol (MIRALAX / GLYCOLAX) 17 g packet Take 17 g by mouth daily as needed for mild constipation. 14 each 0   Probiotic Product (PROBIOTIC DAILY PO) Take 1 capsule by mouth daily.     promethazine (PHENERGAN) 12.5 MG tablet Take 2 tablets (25 mg total) by mouth every 6 (six) hours as needed for nausea, vomiting or refractory nausea / vomiting. 20 tablet 0   No facility-administered medications prior to visit.       Objective:   Physical Exam:  General appearance:  65 y.o., female, NAD, conversant  Eyes: anicteric sclerae, moist conjunctivae; no lid-lag; PERRL, tracking appropriately HENT: NCAT; oropharynx, MMM, no mucosal ulcerations; normal hard and soft palate Neck: Trachea midline; no lymphadenopathy, no JVD Lungs: inspiratory sqeak over left upper lung field, normal WOB CV: RRR, no MRGs  Abdomen: Soft, non-tender; non-distended, BS present  Extremities: No peripheral edema, radial and DP pulses present bilaterally  Skin: Normal temperature, turgor and texture; no rash Psych: Appropriate affect Neuro: Alert and oriented to person and place, no focal deficit    Vitals:   12/17/20 1354  BP: 112/68  Pulse: 76  Temp: 97.8 F (36.6 C)  TempSrc: Oral  SpO2: 96%  Weight: 178 lb 6.4 oz (80.9 kg)  Height: 5' 2.5" (1.588 m)   96% on RA BMI Readings from Last 3 Encounters:  12/17/20 32.11 kg/m  07/13/20 31.86 kg/m  06/11/18 30.64 kg/m   Wt Readings from Last 3 Encounters:  12/17/20 178 lb 6.4 oz (80.9 kg)  07/13/20 177 lb (80.3 kg)  06/11/18 170 lb 4 oz (77.2 kg)     CBC    Component Value Date/Time   WBC 11.6 (H) 07/14/2020 0321   RBC 3.55 (L) 07/14/2020 0321   HGB 11.6 (L) 07/14/2020 0321   HCT 34.5 (L) 07/14/2020 0321   PLT 132 (L) 07/14/2020 0321   MCV 97.2 07/14/2020 0321   MCH 32.7 07/14/2020 0321   MCHC 33.6 07/14/2020 0321   RDW 12.2 07/14/2020 0321   LYMPHSABS 2.8 06/06/2018 1054   MONOABS 0.9 06/06/2018 1054   EOSABS 0.2 06/06/2018 1054   BASOSABS 0.1 06/06/2018 1054      Chest Imaging: None to review  Pulmonary Functions Testing Results: No flowsheet data found.  None available    Assessment & Plan:   # Nonresolving pneumonia # Chronic cough # Inspiratory squeaks over left upper lung field # History of COPD   Hard to know what's driving cough but asymmetric lung exam in this smoker raises possibility of malignant lesion. Per OSH notes, she's been dealing with repeated bouts of pneumonia but I have  no imaging to review. Also chart history of COPD and she does respond symptomatically to steroids but I have no PFTs to review.  Plan: - CT Chest - PFTs - will hold off on LABA/ICS despite her good steroid responsiveness- she says she gets thrush easily - prn albuterol/atrovent for now     Omar Person, MD [facility] 12/17/2020 2:07 PM

## 2020-12-17 NOTE — Patient Instructions (Signed)
-   we will schedule CT Chest and PFTs (breathing tests) as soon as possible and I will be in touch with results

## 2020-12-21 ENCOUNTER — Telehealth: Payer: Self-pay | Admitting: Student

## 2020-12-21 MED ORDER — FLUCONAZOLE 100 MG PO TABS: 100.0000 mg | ORAL_TABLET | Freq: Every day | ORAL | 0 refills | Status: DC

## 2020-12-21 MED ORDER — PREDNISONE 10 MG PO TABS: ORAL_TABLET | ORAL | 0 refills | Status: DC

## 2020-12-21 MED ORDER — AMOXICILLIN-POT CLAVULANATE 875-125 MG PO TABS: 1.0000 | ORAL_TABLET | Freq: Two times a day (BID) | ORAL | 0 refills | Status: AC

## 2020-12-21 NOTE — Telephone Encounter (Signed)
Augmentin 1 tablet twice daily for 7 days and prednisone taper sent to her pharmacy. Fluconazole prescription also sent in for thrush which she says she gets with every antibiotic prescription.

## 2020-12-21 NOTE — Telephone Encounter (Signed)
Called and spoke with Patient.  Dr. [name] recommendations given.  Understanding stated.  Nothing further at this time.

## 2020-12-21 NOTE — Telephone Encounter (Signed)
Called and spoke with Patient.  Patient stated she was seen by Dr. [name] 12/17/20. Patient stated she has had increased sob, productive cough, with dark yellow sputum.  Patient stated sob is worse at night.  Patient stated she is sleeping only 1-2 hours at a time, because of cough.  Patient stated she is sleep sitting up.  Patient stated she is using albuterol inhaler every 2 hours for sob and Atrovent HFA in the morning and again in the evening.   Patient asked for any prescriptions to be sent [facility] [address] [place]. Patient did ask chest CT. Advised Patient Ct was ordered ASAP and our PCC's wll call to schedule after insurance approval.    Message routed to Dr. [name]

## 2020-12-24 ENCOUNTER — Telehealth: Payer: Self-pay | Admitting: Student

## 2020-12-24 MED ORDER — BENZONATATE 200 MG PO CAPS: 200.0000 mg | ORAL_CAPSULE | Freq: Three times a day (TID) | ORAL | 1 refills | Status: DC | PRN

## 2020-12-24 NOTE — Telephone Encounter (Signed)
Called and spoke with patient. She stated that the cough has not improved since she called the office 3 days ago. She is still coughing up thick green phlegm. The cough tends to increase every day around 5pm and lasts all day. She has only been able to sleep 82mins-1 hr at night at the most. She has to remain in a sitting position. She has also noticed that she has developed a pounding headache from all of the coughing.   She is taking her prednisone taper as well as the amoxicillin. She has been using the albuterol HFA every 2 hours. Per patient, she can not wait the usual 4-6 hrs in between albuterol because of her SOB.   She wants to know if there is anything else should be taking to help with the cough since she is in misery.   [facility] [place]   Dr. [name], can you please advise? Thanks!

## 2020-12-24 NOTE — Telephone Encounter (Signed)
Kathryn Hicks states when she looked earlier CT had not been scheduled.  Nothing further needed.

## 2020-12-24 NOTE — Telephone Encounter (Signed)
We will try to expedite CT Chest, ordered ASAP. She had a focal exam finding with inspiratory squeak over left upper lung field and may have a structural cause of cough that may be hard to treat with medications alone. She still has 4 days left of augmentin and 2 days left of prednisone, also prescribed 7 days of fluconazole for the thrush she says she gets every time she's on an antibiotic. I have prescribed tessalon perles but she can also try dextromethorphan over the counter for symptomatic treatment.

## 2020-12-24 NOTE — Telephone Encounter (Signed)
Called and spoke with patient. She verbalized understanding. She is interested in having the CT now as well. I advised her that I would send a message over to the [facility] to let them know to schedule the CT now and she will receive a call in regards to the appointment.   PCCs, can we get her scheduled for a CT scan within the next week? Thanks!

## 2020-12-24 NOTE — Telephone Encounter (Signed)
The CT is already scheduled for Tuesday - 9/27.  Sent message to Cherina to make her aware since she asked for it to be moved to be done within the week.

## 2020-12-24 NOTE — Telephone Encounter (Signed)
Pt calling states she has been coughing a lot and unable to sleep. Pt states she has been using her Albuterol inhaler every 4hrs-usually every 2 hours-has 80 puffs left. Also has Atrovent-80 puffs Pt finished medicine meds NM prescribed. Please advise [phone]

## 2020-12-28 ENCOUNTER — Other Ambulatory Visit: Payer: Self-pay

## 2020-12-28 ENCOUNTER — Ambulatory Visit (HOSPITAL_BASED_OUTPATIENT_CLINIC_OR_DEPARTMENT_OTHER)
Admission: RE | Admit: 2020-12-28 | Discharge: 2020-12-28 | Disposition: A | Discharge status: Home / Self Care | Payer: Medicare Other | Source: Ambulatory Visit | Attending: Student | Admitting: Student

## 2020-12-28 DIAGNOSIS — R918 Other nonspecific abnormal finding of lung field: Secondary | ICD-10-CM | POA: Diagnosis present

## 2020-12-28 DIAGNOSIS — R059 Cough, unspecified: Secondary | ICD-10-CM | POA: Diagnosis present

## 2020-12-30 ENCOUNTER — Telehealth: Payer: Self-pay | Admitting: Student

## 2020-12-30 MED ORDER — ALBUTEROL SULFATE HFA 108 (90 BASE) MCG/ACT IN AERS: INHALATION_SPRAY | RESPIRATORY_TRACT | 5 refills | Status: DC

## 2020-12-30 MED ORDER — IPRATROPIUM BROMIDE HFA 17 MCG/ACT IN AERS: 2.0000 | INHALATION_SPRAY | Freq: Four times a day (QID) | RESPIRATORY_TRACT | 5 refills | Status: DC

## 2020-12-30 NOTE — Telephone Encounter (Signed)
Called and spoke with Patient.  Patient requested albuterol and atrovent refills to [facility] [place].  Requested refills sent to requested pharmacy.  Nothing further at this time.    Per Dr. [name] 12/17/20- Plan: - CT Chest - PFTs - will hold off on LABA/ICS despite her good steroid responsiveness- she says she gets thrush easily - prn albuterol/atrovent for now

## 2021-01-03 ENCOUNTER — Telehealth: Payer: Self-pay | Admitting: Student

## 2021-01-03 MED ORDER — PREDNISONE 10 MG PO TABS: ORAL_TABLET | ORAL | 0 refills | Status: DC

## 2021-01-03 NOTE — Telephone Encounter (Signed)
Called patient, she verbalized understanding of RA's recommendations. RX [facility] [place].   Nothing further needed at time of call.

## 2021-01-03 NOTE — Telephone Encounter (Signed)
Called and spoke with Patient.  Patient stated she feels her cough is getting worse.  Patient stated she finished antibiotic and Prednisone previously prescribed 12/21/20. Patient stated productive cough, with thick, clear sputum, that feels like it chokes her when coughing.  Patient stated she feels droggy,  increased fatigue, and has some wheezing.  Patient stated she does  home covid test often, last tested 2 days ago, and test was negative. Patient stated she has coughing fits that last up to 30 minutes at a times. Patient is taking Delsym, but does not seem to help very long.  Patient was prescribed Tessalon, but pharmacy is currently out of stock.  Patient is using her albuterol inhaler every 4 hours, but it also is not helping.  Patient stated she has beenusing Atrovent HFA twice a day, with not help. [facility] [address] [place].   LOV- 12/17/20 Dr. [name]- Plan: - CT Chest - PFTs - will hold off on LABA/ICS despite her good steroid responsiveness- she says she gets thrush easily - prn albuterol/atrovent for now   Message routed to Dr. Vassie Loll (DOD) -Dr. [name] is currently unavailable.

## 2021-01-04 ENCOUNTER — Telehealth: Payer: Self-pay | Admitting: Student

## 2021-01-04 MED ORDER — IPRATROPIUM BROMIDE HFA 17 MCG/ACT IN AERS: 2.0000 | INHALATION_SPRAY | Freq: Four times a day (QID) | RESPIRATORY_TRACT | 3 refills | Status: DC

## 2021-01-04 MED ORDER — ALBUTEROL SULFATE HFA 108 (90 BASE) MCG/ACT IN AERS: INHALATION_SPRAY | RESPIRATORY_TRACT | 3 refills | Status: DC

## 2021-01-04 NOTE — Telephone Encounter (Signed)
I have sent the medications into the mail order pharmacy and pt is aware.

## 2021-01-10 ENCOUNTER — Ambulatory Visit (INDEPENDENT_AMBULATORY_CARE_PROVIDER_SITE_OTHER): Payer: Medicare Other | Admitting: Student

## 2021-01-10 ENCOUNTER — Other Ambulatory Visit: Payer: Self-pay

## 2021-01-10 DIAGNOSIS — R918 Other nonspecific abnormal finding of lung field: Secondary | ICD-10-CM

## 2021-01-10 LAB — PULMONARY FUNCTION TEST
DL/VA % pred: 126 %
DL/VA: 5.34 ml/min/mmHg/L
DLCO cor % pred: 106 %
DLCO cor: 19.72 ml/min/mmHg
DLCO unc % pred: 106 %
DLCO unc: 19.72 ml/min/mmHg
FEF 25-75 Post: 3.23 L/sec
FEF 25-75 Pre: 2.29 L/sec
FEF2575-%Change-Post: 40 %
FEF2575-%Pred-Post: 160 %
FEF2575-%Pred-Pre: 113 %
FEV1-%Change-Post: 12 %
FEV1-%Pred-Post: 76 %
FEV1-%Pred-Pre: 67 %
FEV1-Post: 1.7 L
FEV1-Pre: 1.51 L
FEV1FVC-%Change-Post: 4 %
FEV1FVC-%Pred-Pre: 112 %
FEV6-%Change-Post: 10 %
FEV6-%Pred-Post: 66 %
FEV6-%Pred-Pre: 60 %
FEV6-Post: 1.87 L
FEV6-Pre: 1.7 L
FEV6FVC-%Change-Post: 1 %
FEV6FVC-%Pred-Post: 104 %
FEV6FVC-%Pred-Pre: 102 %
FVC-%Change-Post: 8 %
FVC-%Pred-Post: 64 %
FVC-%Pred-Pre: 59 %
FVC-Post: 1.87 L
FVC-Pre: 1.73 L
Post FEV1/FVC ratio: 90 %
Post FEV6/FVC ratio: 100 %
Pre FEV1/FVC ratio: 87 %
Pre FEV6/FVC Ratio: 99 %
RV % pred: 129 %
RV: 2.58 L
TLC % pred: 92 %
TLC: 4.42 L

## 2021-01-10 NOTE — Progress Notes (Signed)
PFT done today. 

## 2021-01-13 ENCOUNTER — Telehealth: Payer: Self-pay | Admitting: Student

## 2021-01-13 DIAGNOSIS — R918 Other nonspecific abnormal finding of lung field: Secondary | ICD-10-CM

## 2021-01-13 MED ORDER — ANORO ELLIPTA 62.5-25 MCG/INH IN AEPB: 1.0000 | INHALATION_SPRAY | Freq: Every day | RESPIRATORY_TRACT | 11 refills | Status: DC

## 2021-01-13 MED ORDER — ANORO ELLIPTA 62.5-25 MCG/INH IN AEPB
1.0000 | INHALATION_SPRAY | Freq: Every day | RESPIRATORY_TRACT | 11 refills | Status: DC
Start: 2021-01-13 — End: 2021-02-07

## 2021-01-13 MED ORDER — ALBUTEROL SULFATE HFA 108 (90 BASE) MCG/ACT IN AERS: INHALATION_SPRAY | RESPIRATORY_TRACT | 3 refills | Status: DC

## 2021-01-13 NOTE — Telephone Encounter (Signed)
Encounter for [employer], orders:  Nothing on the ct chest that clearly explains shortness breath or cough - there are some very subtle areas of groundglass however which would represent a pneumonia that resolved, could reflect very early or mild interstitial lung disease, and less likely could be very early form of cancer, I think it would be a good idea to get a follow up scan in 3 months which I have ordered. PFTs do have air trapping and borderline bronchodilator response despite recent courses of steroids so there may be component of asthma/copd overlap. I have prescribed an inhaler called anoro 1 puff once daily (take a strong quick breath in through the inhaler and hold for 5-10 seconds before breathing out again). [facility] [place] [facility] If too expensive let us know and we can try an alternative. You can also call your insurer to ask which LABA/LAMA inhaler would be covered best by your insurance and then let us know.

## 2021-01-18 NOTE — Telephone Encounter (Signed)
Spoke with pt with pt who states she will not be able to get Anoro until 01/22/21 r/t insurance. Pt is stating increased SOB and wheezing. Pt also states not be able to sleep at night r/t increased cough. Pt will be seeing Dr. [name] tomorrow in OV appt. Pt was advised is symptoms get worse to seek medical evaluation at [facility] or ED. Dr. Sherene Sires could you please advise as Dr. [name] is unavailable.

## 2021-01-18 NOTE — Telephone Encounter (Signed)
I have called the pt and she is aware of MW recs.   She stated that she will keep her appt with NM tomorrow and if she has to she will go to the ER for treatment.

## 2021-01-18 NOTE — Telephone Encounter (Signed)
If can't get comfortable on max albuterol  ( 2 puffs every 4 hours) then will need to be seen in office or ER today if we don't have availabilty with all meds in hand

## 2021-01-19 ENCOUNTER — Other Ambulatory Visit: Payer: Self-pay

## 2021-01-19 ENCOUNTER — Ambulatory Visit (INDEPENDENT_AMBULATORY_CARE_PROVIDER_SITE_OTHER): Payer: Medicare Other

## 2021-01-19 ENCOUNTER — Encounter: Payer: Self-pay | Admitting: Student

## 2021-01-19 ENCOUNTER — Ambulatory Visit (INDEPENDENT_AMBULATORY_CARE_PROVIDER_SITE_OTHER): Payer: Medicare Other | Admitting: Student

## 2021-01-19 VITALS — BP 116/60 | HR 99 | Temp 98.3°F | Ht 62.0 in | Wt 180.2 lb

## 2021-01-19 DIAGNOSIS — J441 Chronic obstructive pulmonary disease with (acute) exacerbation: Secondary | ICD-10-CM

## 2021-01-19 DIAGNOSIS — R053 Chronic cough: Secondary | ICD-10-CM

## 2021-01-19 MED ORDER — PREDNISONE 10 MG PO TABS: ORAL_TABLET | ORAL | 0 refills | Status: DC

## 2021-01-19 NOTE — Progress Notes (Signed)
Synopsis: Referred for COPD with exacerbation by Marylen Ponto, MD  Subjective:   PATIENT ID: Kathryn Hicks GENDER: female DOB: 1956/01/08, MRN: [id number]  Chief Complaint  Patient presents with   Follow-up    Pt. Says she can't breathe. Coughing all night long. Green and brown sputum.      She has history of COPD, GERD, hypothyroid and is referred for COPD with exacerbation.   4 months ago with severe cough, especially over last 6 weeks. Has been to urgent care x3, ED x1. Feels better when she gets steroids. It is productive cough. No fever throughout. 3 rounds of ABX (clindamcyin and a couple of others). Did take fluconazole as well. Only feels dyspneic when she is coughing. Only has chest pain when she's been coughing.  Interval HPI: Cough comes and goes along with dypsnea. Improves with steroid and antibiotic and then worsens not long after stopping. CT Chest with only a couple of subtle ggos. PFTs with air trapping. She does have some CP but mostly in association with coughing.    Past Medical History:  Diagnosis Date   Anxiety    Depression    GERD (gastroesophageal reflux disease)    Hypertension    Hypothyroidism    followed by pcp   Migraines    OA (osteoarthritis)    knees   Pneumonia    hx of    PONV (postoperative nausea and vomiting)    woke up during knee surgery    Wears glasses      Family History  Problem Relation Age of Onset   Prostate cancer Father    Heart attack Father    Wilm's tumor Daughter      Past Surgical History:  Procedure Laterality Date   ABDOMINAL HYSTERECTOMY  2003    w/  Bilateral Salpingo-oophorectomy   CARPAL TUNNEL RELEASE Left 2014   CESAREAN SECTION  x2  last one 1981   COLONOSCOPY     KNEE ARTHROSCOPY Left 06-21-2017   dr Darrelyn Hillock   LAPAROSCOPIC CHOLECYSTECTOMY  2004   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   Procedure: TOTAL KNEE ARTHROPLASTY;  Surgeon: [name], MD;  Location: [facility];  Service:  Orthopedics;  Laterality: Left;    TOTAL KNEE ARTHROPLASTY Right 07/13/2020   Procedure: TOTAL KNEE ARTHROPLASTY;  Surgeon: Durene Romans, MD;  Location: [facility];  Service: Orthopedics;  Laterality: Right;  70 mins    Social History   Socioeconomic History   Marital status: Married    Spouse name: Not on file   Number of children: Not on file   Years of education: Not on file   Highest education level: Not on file  Occupational History   Not on file  Tobacco Use   Smoking status: Former    Years: 10.00    Types: Cigarettes    Quit date: 05/22/1977    Years since quitting: 43.6   Smokeless tobacco: Never  Vaping Use   Vaping Use: Never used  Substance and Sexual Activity   Alcohol use: Never    Comment: rare   Drug use: Never   Sexual activity: Not on file  Other Topics Concern   Not on file  Social History Narrative   Not on file   Social Determinants of Health   Financial Resource Strain: Not on file  Food Insecurity: Not on file  Transportation Needs: Not on file  Physical Activity: Not on file  Stress: Not on file  Social Connections: Not on  file  Intimate Partner Violence: Not on file     Allergies  Allergen Reactions   Butalbital Hives    Other reaction(s): Other (See Comments) unknown   Sporanos [Itraconazole] Hives and Rash    Welts full body     Outpatient Medications Prior to Visit  Medication Sig Dispense Refill   albuterol (VENTOLIN HFA) 108 (90 Base) MCG/ACT inhaler SMARTSIG:2 Puff(s) By Mouth Every 4-6 Hours PRN 24 g 3   almotriptan (AXERT) 12.5 MG tablet Take 12.5 mg by mouth as needed for migraine. may repeat in 2 hours if needed     ALPRAZolam (XANAX) 1 MG tablet Take 1 mg by mouth at bedtime.     amLODipine (NORVASC) 2.5 MG tablet Take 2.5 mg by mouth daily.     benzonatate (TESSALON) 200 MG capsule Take 1 capsule (200 mg total) by mouth 3 (three) times daily as needed for cough. 30 capsule 1   Biotin 5000 MCG TABS Take 5,000 mcg by  mouth daily.      Brimonidine Tartrate (LUMIFY) 0.025 % SOLN Place 1 drop into both eyes daily as needed (dry eyes).     cholecalciferol (VITAMIN D3) 25 MCG (1000 UT) tablet Take 1,000 Units by mouth daily.     diphenhydrAMINE (BENADRYL) 25 MG tablet Take 25 mg by mouth daily as needed for allergies.     esomeprazole (NEXIUM) 20 MG capsule Take 20 mg by mouth daily as needed.      estrogens, conjugated, (PREMARIN) 0.9 MG tablet Take 0.9 mg by mouth daily.      fluconazole (DIFLUCAN) 100 MG tablet Take 1 tablet (100 mg total) by mouth daily. 7 tablet 0   lamoTRIgine (LAMICTAL) 150 MG tablet Take 150 mg by mouth 2 (two) times daily.     levothyroxine (SYNTHROID, LEVOTHROID) 25 MCG tablet Take 25 mcg by mouth daily before breakfast.     Misc Natural Products (OSTEO BI-FLEX TRIPLE STRENGTH PO) Take 1 tablet by mouth daily.      olmesartan-hydrochlorothiazide (BENICAR HCT) 40-25 MG tablet Take 1 tablet by mouth daily.     predniSONE (DELTASONE) 10 MG tablet Take 2 tablets once daily for 5 days, then 1 tablet once daily for 5 days until finished. 15 tablet 0   umeclidinium-vilanterol (ANORO ELLIPTA) 62.5-25 MCG/INH AEPB Inhale 1 puff into the lungs daily. 1 each 11   methocarbamol (ROBAXIN) 500 MG tablet Take 1 tablet (500 mg total) by mouth every 6 (six) hours as needed for muscle spasms. 40 tablet 0   No facility-administered medications prior to visit.       Objective:   Physical Exam:  General appearance: 65 y.o., female, NAD, conversant  Eyes: anicteric sclerae, moist conjunctivae; no lid-lag; PERRL, tracking appropriately HENT: NCAT; oropharynx, MMM, no mucosal ulcerations; normal hard and soft palate Neck: Trachea midline; no lymphadenopathy, no JVD Lungs: Biphasic wheeze diffusely, squeaks, mildly increased WOB but can complete sentences CV: RRR, no MRGs  Abdomen: Soft, non-tender; non-distended, BS present  Extremities: No peripheral edema, radial and DP pulses present bilaterally   Skin: Normal temperature, turgor and texture; no rash Psych: Appropriate affect Neuro: Alert and oriented to person and place, no focal deficit    Vitals:   01/19/21 1340  BP: 116/60  Pulse: 99  Temp: 98.3 F (36.8 C)  TempSrc: Oral  SpO2: (!) 89%  Weight: 180 lb 3.2 oz (81.7 kg)  Height: 5\' 2"  (1.575 m)    (!) 89% on RA, desaturation to 87% walking BMI Readings  from Last 3 Encounters:  01/19/21 32.96 kg/m  12/17/20 32.11 kg/m  07/13/20 31.86 kg/m   Wt Readings from Last 3 Encounters:  01/19/21 180 lb 3.2 oz (81.7 kg)  12/17/20 178 lb 6.4 oz (80.9 kg)  07/13/20 177 lb (80.3 kg)     CBC    Component Value Date/Time   WBC 11.6 (H) 07/14/2020 0321   RBC 3.55 (L) 07/14/2020 0321   HGB 11.6 (L) 07/14/2020 0321   HCT 34.5 (L) 07/14/2020 0321   PLT 132 (L) 07/14/2020 0321   MCV 97.2 07/14/2020 0321   MCH 32.7 07/14/2020 0321   MCHC 33.6 07/14/2020 0321   RDW 12.2 07/14/2020 0321   LYMPHSABS 2.8 06/06/2018 1054   MONOABS 0.9 06/06/2018 1054   EOSABS 0.2 06/06/2018 1054   BASOSABS 0.1 06/06/2018 1054      Chest Imaging: CT Chest 12/28/20 with 2 areas of subtle patchy ggo, otherwise unremarkable  Pulmonary Functions Testing Results: PFT Results Latest Ref Rng & Units 01/10/2021  FVC-Pre L 1.73  FVC-Predicted Pre % 59  FVC-Post L 1.87  FVC-Predicted Post % 64  Pre FEV1/FVC % % 87  Post FEV1/FCV % % 90  FEV1-Pre L 1.51  FEV1-Predicted Pre % 67  FEV1-Post L 1.70  DLCO uncorrected ml/min/mmHg 19.72  DLCO UNC% % 106  DLCO corrected ml/min/mmHg 19.72  DLCO COR %Predicted % 106  DLVA Predicted % 126  TLC L 4.42  TLC % Predicted % 92  RV % Predicted % 129   PFT 01/10/21 Air trapping and borderline significant bronchodilator response      Assessment & Plan:   # Nonresolving pneumonia # Chronic cough # Likely COPD vs Asthma-COPD overlap with acute exacerbation # Acute vs chronic hypoxic respiratory failure. Sounds like another exacerbation of  COPD/ACOS. I think she would benefit from ICS as component of her controller inhalers but she declines due to issues with thrush despite rinsing mouth out - will revisit and next follow-up appointment after starting anoro if remains under poor control.   # Multifocal ggo: May represent resolving pneumonia vs noninfectious pneumonia vs malignancy.  Plan: - CT Chest surveillance of ggo nodules scheduled for January - CXR today - anoro 1 puff daily - prn albuterol - flutter valve 10 slow but firm puffs twice daily after bronchodilators - prednisone taper  - O2 2L per [place] with exertion and sleep, reassess need at next visit with hopefully improved COPD control  - ED precautions if having trouble completing sentences, dyspnea refractory     Omar Person, MD [facility] 01/19/2021 4:50 PM

## 2021-01-19 NOTE — Patient Instructions (Signed)
-   anoro 1 puff once daily - albuterol as needed - after each use of anoro and albuterol: flutter valve 10 slow but firm puffs  - we will check to see if you need oxygen today - chest x ray today

## 2021-01-24 ENCOUNTER — Ambulatory Visit (INDEPENDENT_AMBULATORY_CARE_PROVIDER_SITE_OTHER): Payer: Medicare Other | Admitting: Primary Care

## 2021-01-24 ENCOUNTER — Telehealth: Payer: Self-pay | Admitting: Student

## 2021-01-24 ENCOUNTER — Other Ambulatory Visit: Payer: Self-pay

## 2021-01-24 ENCOUNTER — Telehealth: Payer: Self-pay | Admitting: Internal Medicine

## 2021-01-24 ENCOUNTER — Telehealth: Payer: Self-pay | Admitting: Pulmonary Disease

## 2021-01-24 ENCOUNTER — Encounter: Payer: Self-pay | Admitting: Primary Care

## 2021-01-24 VITALS — BP 120/80 | HR 89 | Temp 98.1°F | Ht 62.5 in | Wt 176.8 lb

## 2021-01-24 DIAGNOSIS — B370 Candidal stomatitis: Secondary | ICD-10-CM | POA: Diagnosis not present

## 2021-01-24 DIAGNOSIS — J45901 Unspecified asthma with (acute) exacerbation: Secondary | ICD-10-CM

## 2021-01-24 DIAGNOSIS — J4551 Severe persistent asthma with (acute) exacerbation: Secondary | ICD-10-CM

## 2021-01-24 DIAGNOSIS — J441 Chronic obstructive pulmonary disease with (acute) exacerbation: Secondary | ICD-10-CM | POA: Diagnosis not present

## 2021-01-24 DIAGNOSIS — R9389 Abnormal findings on diagnostic imaging of other specified body structures: Secondary | ICD-10-CM

## 2021-01-24 DIAGNOSIS — J45909 Unspecified asthma, uncomplicated: Secondary | ICD-10-CM | POA: Insufficient documentation

## 2021-01-24 MED ORDER — PREDNISONE 10 MG PO TABS: ORAL_TABLET | ORAL | 0 refills | Status: DC

## 2021-01-24 MED ORDER — AMOXICILLIN-POT CLAVULANATE 875-125 MG PO TABS: 1.0000 | ORAL_TABLET | Freq: Two times a day (BID) | ORAL | 0 refills | Status: DC

## 2021-01-24 MED ORDER — NYSTATIN 100000 UNIT/ML MT SUSP: 5.0000 mL | Freq: Four times a day (QID) | OROMUCOSAL | 1 refills | Status: DC

## 2021-01-24 MED ORDER — BREZTRI AEROSPHERE 160-9-4.8 MCG/ACT IN AERO: 160.0000 ug | INHALATION_SPRAY | Freq: Two times a day (BID) | RESPIRATORY_TRACT | 0 refills | Status: DC

## 2021-01-24 MED ORDER — METHYLPREDNISOLONE ACETATE 80 MG/ML IJ SUSP
80.0000 mg | Freq: Once | INTRAMUSCULAR | Status: AC
  Administered 2021-01-24: 80 mg via INTRAMUSCULAR

## 2021-01-24 NOTE — Assessment & Plan Note (Addendum)
-   Prolonged exacerbation, currently on second round of oral prednisone with persistent symptoms. She has insp/exp wheezing on exam. Received depo-medrol 80mg  IM x1 in office today. Needs to be started on ICS. Changing Anoro Elipta to (use with spacer). Rx Augmentin 1 tab BID x 7 days and prednisone 40mg  x 5 days, 20mg  x 5 days; 10mg  x 5 days. FU in 2 weeks with Dr. [employer] or NP.

## 2021-01-24 NOTE — Telephone Encounter (Signed)
I have called the patient back to make a follow up per the provider recommendation. Patient was asked to call back for a OV per Evette Doffing. App of the day.

## 2021-01-24 NOTE — Telephone Encounter (Signed)
disregard

## 2021-01-24 NOTE — Assessment & Plan Note (Signed)
-   At risk for oral thursh, sending in RX Nystatin 69ml QID to have on hand

## 2021-01-24 NOTE — Assessment & Plan Note (Addendum)
CT chest in September 2022 showed minimal patchy ground-glass in the left upper lobe. May represent resolving pneumonia vs noninfectious pneumonia vs malignancy.  Plan: - CT Chest surveillance of ggo nodules scheduled for January 2023

## 2021-01-24 NOTE — Progress Notes (Signed)
@Patient  ID: , female    DOB: 1956-02-18, 65 y.o.   MRN: [id number]  No chief complaint on file.   Referring provider: [id number], MD  HPI: 65 year old female, former smoker + secondary hand exposure quit in 1979 (10 pack year hx). PMH significant for COPD-asthma over lap, acute on chronic respiratory failure. Patient of Dr. 71, recently seen on 01/19/21. Maintained on Anoro, recommended adding ICS but patient was reluctant d/t thrush.   01/24/2021- Interim hx  Patient presents today for acute visit. She has had a productive cough with associated chest tightness and wheezing for 4 months. Coughing spells occur early in the morning which will last 30-38min. Cough is productive with dark green-brown mucus. She has tried delsym and robitussin cough syrup. Tesslon perles have not helped. She uses flutter valve three times a day. She has been taking Anoro as directed. She is very reluctant to start ICS. She has 4 days left of current prednisone taper. She wears 2l oxygen at night. She is due CT chest is scheduled for January.    Allergies  Allergen Reactions   Butalbital Hives    Other reaction(s): Other (See Comments) unknown   Sporanos [Itraconazole] Hives and Rash    Welts full body    Immunization History  Administered Date(s) Administered   Influenza,inj,Quad PF,6+ Mos 02/05/2018   Pneumococcal Polysaccharide-23 02/05/2018    Past Medical History:  Diagnosis Date   Anxiety    Depression    GERD (gastroesophageal reflux disease)    Hypertension    Hypothyroidism    followed by pcp   Migraines    OA (osteoarthritis)    knees   Pneumonia    hx of    PONV (postoperative nausea and vomiting)    woke up during knee surgery    Wears glasses     Tobacco History: Social History   Tobacco Use  Smoking Status Former   Years: 10.00   Types: Cigarettes   Quit date: 05/22/1977   Years since quitting: 43.7  Smokeless Tobacco Never   Counseling given:  Not Answered   Outpatient Medications Prior to Visit  Medication Sig Dispense Refill   albuterol (VENTOLIN HFA) 108 (90 Base) MCG/ACT inhaler SMARTSIG:2 Puff(s) By Mouth Every 4-6 Hours PRN 24 g 3   almotriptan (AXERT) 12.5 MG tablet Take 12.5 mg by mouth as needed for migraine. may repeat in 2 hours if needed     ALPRAZolam (XANAX) 1 MG tablet Take 1 mg by mouth at bedtime.     amLODipine (NORVASC) 2.5 MG tablet Take 2.5 mg by mouth daily.     benzonatate (TESSALON) 200 MG capsule Take 1 capsule (200 mg total) by mouth 3 (three) times daily as needed for cough. 30 capsule 1   Biotin 5000 MCG TABS Take 5,000 mcg by mouth daily.      Brimonidine Tartrate (LUMIFY) 0.025 % SOLN Place 1 drop into both eyes daily as needed (dry eyes).     cholecalciferol (VITAMIN D3) 25 MCG (1000 UT) tablet Take 1,000 Units by mouth daily.     diphenhydrAMINE (BENADRYL) 25 MG tablet Take 25 mg by mouth daily as needed for allergies.     esomeprazole (NEXIUM) 20 MG capsule Take 20 mg by mouth daily as needed.      estrogens, conjugated, (PREMARIN) 0.9 MG tablet Take 0.9 mg by mouth daily.      fluconazole (DIFLUCAN) 100 MG tablet Take 1 tablet (100 mg total) by mouth  daily. 7 tablet 0   lamoTRIgine (LAMICTAL) 150 MG tablet Take 150 mg by mouth 2 (two) times daily.     levothyroxine (SYNTHROID, LEVOTHROID) 25 MCG tablet Take 25 mcg by mouth daily before breakfast.     Misc Natural Products (OSTEO BI-FLEX TRIPLE STRENGTH PO) Take 1 tablet by mouth daily.      olmesartan-hydrochlorothiazide (BENICAR HCT) 40-25 MG tablet Take 1 tablet by mouth daily.     umeclidinium-vilanterol (ANORO ELLIPTA) 62.5-25 MCG/INH AEPB Inhale 1 puff into the lungs daily. 1 each 11   predniSONE (DELTASONE) 10 MG tablet Take 2 tablets once daily for 5 days, then 1 tablet once daily for 5 days until finished. 15 tablet 0   predniSONE (DELTASONE) 10 MG tablet Take 6 tablets (60 mg total) by mouth daily with breakfast for 2 days, THEN 4 tablets  (40 mg total) daily with breakfast for 3 days, THEN 3 tablets (30 mg total) daily with breakfast for 3 days, THEN 2 tablets (20 mg total) daily with breakfast for 3 days, THEN 1 tablet (10 mg total) daily with breakfast for 3 days. 42 tablet 0   No facility-administered medications prior to visit.    Review of Systems  Review of Systems  Constitutional: Negative.   HENT: Negative.    Respiratory:  Positive for cough, chest tightness, shortness of breath and wheezing.     Physical Exam  BP 120/80 (BP Location: Left Arm, Patient Position: Sitting, Cuff Size: Normal)   Pulse 89   Temp 98.1 F (36.7 C) (Oral)   Ht 5' 2.5" (1.588 m)   Wt 176 lb 12.8 oz (80.2 kg)   SpO2 98%   BMI 31.82 kg/m  Physical Exam Constitutional:      Appearance: Normal appearance.  HENT:     Head: Normocephalic and atraumatic.     Mouth/Throat:     Mouth: Mucous membranes are moist.     Pharynx: Oropharynx is clear.  Cardiovascular:     Rate and Rhythm: Normal rate and regular rhythm.  Pulmonary:     Effort: Pulmonary effort is normal.     Breath sounds: Wheezing present. No rhonchi or rales.  Musculoskeletal:        General: Normal range of motion.  Skin:    General: Skin is warm and dry.  Neurological:     General: No focal deficit present.     Mental Status: She is alert and oriented to person, place, and time. Mental status is at baseline.  Psychiatric:        Mood and Affect: Mood normal.        Behavior: Behavior normal.        Thought Content: Thought content normal.        Judgment: Judgment normal.     Lab Results:  CBC    Component Value Date/Time   WBC 11.6 (H) 07/14/2020 0321   RBC 3.55 (L) 07/14/2020 0321   HGB 11.6 (L) 07/14/2020 0321   HCT 34.5 (L) 07/14/2020 0321   PLT 132 (L) 07/14/2020 0321   MCV 97.2 07/14/2020 0321   MCH 32.7 07/14/2020 0321   MCHC 33.6 07/14/2020 0321   RDW 12.2 07/14/2020 0321   LYMPHSABS 2.8 06/06/2018 1054   MONOABS 0.9 06/06/2018 1054    EOSABS 0.2 06/06/2018 1054   BASOSABS 0.1 06/06/2018 1054    BMET    Component Value Date/Time   NA 135 07/14/2020 0321   K 4.0 07/14/2020 0321   CL 100 07/14/2020 0321  CO2 26 07/14/2020 0321   GLUCOSE 145 (H) 07/14/2020 0321   BUN 15 07/14/2020 0321   CREATININE 0.81 07/14/2020 0321   CALCIUM 9.0 07/14/2020 0321   GFRNONAA >60 07/14/2020 0321   GFRAA >60 06/13/2018 0530    BNP No results found for: BNP  ProBNP No results found for: PROBNP  Imaging: DG Chest 2 View  Result Date: 01/20/2021 CLINICAL DATA:  Shortness of breath. EXAM: CHEST - 2 VIEW COMPARISON:  Chest x-ray dated October 28, 2020. FINDINGS: The heart size and mediastinal contours are within normal limits. Both lungs are clear. The visualized skeletal structures are unremarkable. IMPRESSION: No active cardiopulmonary disease. Electronically Signed   By: Obie Dredge M.D.   On: 01/20/2021 16:08   CT Chest Wo Contrast  Result Date: 12/29/2020 CLINICAL DATA:  Cough, bronchitis. EXAM: CT CHEST WITHOUT CONTRAST TECHNIQUE: Multidetector CT imaging of the chest was performed following the standard protocol without IV contrast. COMPARISON:  None. FINDINGS: Cardiovascular: Atherosclerotic calcification of the aorta and coronary arteries. Pulmonic trunk is enlarged. Heart size normal. No pericardial effusion. Mediastinum/Nodes: Subcentimeter incidental left thyroid nodule. No follow-up imaging is recommended. Reference: J Am Coll Radiol. 2015 Feb;12(2): 143-50. No pathologically enlarged mediastinal or axillary lymph nodes. Hilar regions are difficult to definitively evaluate without IV contrast. Esophagus is grossly unremarkable. Lungs/Pleura: Minimal patchy amorphous ground-glass in the left upper lobe. Lingular scarring. No pleural fluid. Airway is unremarkable. Upper Abdomen: Liver margin is irregular. Cholecystectomy. Visualized portions of the adrenal glands, kidneys, spleen pancreas, stomach and bowel are grossly  unremarkable. Musculoskeletal: Degenerative changes in the spine. No worrisome lytic or sclerotic lesions. IMPRESSION: 1. Minimal patchy ground-glass in the left upper lobe, likely infectious or inflammatory in etiology. Otherwise, no acute findings in the chest. 2. Cirrhosis. 3. Aortic atherosclerosis (ICD10-I70.0). Coronary artery calcification. 4. Enlarged pulmonic trunk, indicative of pulmonary arterial hypertension. Electronically Signed   By: Leanna Battles M.D.   On: 12/29/2020 10:10     Assessment & Plan:   Asthma with COPD with exacerbation (HCC) - Prolonged exacerbation, currently on second round of oral prednisone with persistent symptoms. She has insp/exp wheezing on exam. Received depo-medrol 80mg  IM x1 in office today. Needs to be started on ICS. Changing Anoro Elipta to (use with spacer). Rx Augmentin 1 tab BID x 7 days and prednisone 40mg  x 5 days, 20mg  x 5 days; 10mg  x 5 days. FU in 2 weeks with Dr. [employer] or NP.   Oral thrush - At risk for oral thursh, sending in RX Nystatin 19ml QID to have on hand   Abnormal CT of the chest CT chest in September 2022 showed minimal patchy ground-glass in the left upper lobe. May represent resolving pneumonia vs noninfectious pneumonia vs malignancy.   Plan: - CT Chest surveillance of ggo nodules scheduled for January 2023  40 mins spent on case; > 50% face to face with patient   Waynetta Sandy, NP 01/24/2021

## 2021-01-24 NOTE — Telephone Encounter (Signed)
Patient has an OV with Ames Dura for a visit. Nothing further needed.

## 2021-01-24 NOTE — Telephone Encounter (Signed)
Primary Pulmonologist: Felisa Bonier Last office visit and with whom: 01/19/2021 with [facility] What do we see them for (pulmonary problems): COPD Last OV assessment/plan: 01/19/21,   - CT Chest surveillance of ggo nodules scheduled for January - CXR today - anoro 1 puff daily - prn albuterol - flutter valve 10 slow but firm puffs twice daily after bronchodilators - prednisone taper  - O2 2L per [place] with exertion and sleep, reassess need at next visit with hopefully improved COPD control  - ED precautions if having trouble completing sentences, dyspnea refractory   Was appointment offered to patient (explain)?  She declined, she does not do well with virtual visit.    Reason for call: she reports that she is having more that more thick brown mucus coming up. She is taking her prednisone, Anoro and is using 2 liters of Oxygen as needed. She is also using a flutter valve to get her mucus up. She is requesting an antibiotic to help her clear up the mucus. She reports that she does not want to go to the hospital or urgent care either. Pharmacy is [facility]. Please advise.    (examples of things to ask: : When did symptoms start? Fever? Cough? Productive? Color to sputum? More sputum than usual? Wheezing? Have you needed increased oxygen? Are you taking your respiratory medications? What over the counter measures have you tried?)  Allergies  Allergen Reactions   Butalbital Hives    Other reaction(s): Other (See Comments) unknown   Sporanos [Itraconazole] Hives and Rash    Welts full body    Immunization History  Administered Date(s) Administered   Influenza,inj,Quad PF,6+ Mos 02/05/2018   Pneumococcal Polysaccharide-23 02/05/2018

## 2021-01-24 NOTE — Patient Instructions (Addendum)
Recommendations: - Stop Anoro - Start [employer] - take two puffs twice daily (Use with Spacer and rinse mouth after use) - Continue Albuterol rescue inhaler 2 puffs every 6 hours for breakthrough shortness of breath or wheezing  Orders: - Depo medrol 80mg  IM x 1  Rx: - Augmentin 1 twice daily x 7 days  - New prednisone taper start tomorrow   Follow-up: - 2 weeks with Dr. or with [name] NP

## 2021-02-07 ENCOUNTER — Ambulatory Visit (INDEPENDENT_AMBULATORY_CARE_PROVIDER_SITE_OTHER): Payer: Medicare Other | Admitting: Primary Care

## 2021-02-07 ENCOUNTER — Encounter: Payer: Self-pay | Admitting: Primary Care

## 2021-02-07 ENCOUNTER — Other Ambulatory Visit: Payer: Self-pay

## 2021-02-07 ENCOUNTER — Ambulatory Visit: Payer: PRIVATE HEALTH INSURANCE | Admitting: Primary Care

## 2021-02-07 VITALS — BP 110/68 | HR 74 | Ht 62.5 in | Wt 178.6 lb

## 2021-02-07 DIAGNOSIS — B370 Candidal stomatitis: Secondary | ICD-10-CM

## 2021-02-07 DIAGNOSIS — R9389 Abnormal findings on diagnostic imaging of other specified body structures: Secondary | ICD-10-CM

## 2021-02-07 DIAGNOSIS — J45901 Unspecified asthma with (acute) exacerbation: Secondary | ICD-10-CM

## 2021-02-07 DIAGNOSIS — Z23 Encounter for immunization: Secondary | ICD-10-CM

## 2021-02-07 DIAGNOSIS — J441 Chronic obstructive pulmonary disease with (acute) exacerbation: Secondary | ICD-10-CM | POA: Diagnosis not present

## 2021-02-07 MED ORDER — BREZTRI AEROSPHERE 160-9-4.8 MCG/ACT IN AERO: 2.0000 | INHALATION_SPRAY | Freq: Two times a day (BID) | RESPIRATORY_TRACT | 3 refills | Status: DC

## 2021-02-07 MED ORDER — ALBUTEROL SULFATE HFA 108 (90 BASE) MCG/ACT IN AERS: INHALATION_SPRAY | RESPIRATORY_TRACT | 3 refills | Status: DC

## 2021-02-07 NOTE — Assessment & Plan Note (Addendum)
-   Resolved. Patient is prone to recurrent exacerbations require multiple rounds of prednisone. ICS was added back in October 2022 and symptoms improved. Continue Breztri Aerosphere 1-2 puffs morning and evening. May cosnider de-esclate therapy to low-medium dose ICS/LABA at next visit. No obstruction on PFTs. FU in 3 months

## 2021-02-07 NOTE — Progress Notes (Signed)
@Patient  ID: , female    DOB: November 14, 1955, 65 y.o.   MRN: [id number]  Chief Complaint  Patient presents with   Follow-up    Referring provider: [id number], MD  HPI: 65 year old female, former smoker + secondary hand exposure quit in 1979 (10 pack year hx). PMH significant for COPD-asthma over lap, acute on chronic respiratory failure. Patient of Dr. 71, recently seen on 01/19/21. Maintained on Anoro, recommended adding ICS but patient was reluctant d/t thrush.    Previous [facility] pulmonary encounter:  01/24/2021 Patient presents today for acute visit. She has had a productive cough with associated chest tightness and wheezing for 4 months. Coughing spells occur early in the morning which will last 30-17min. Cough is productive with dark green-brown mucus. She has tried delsym and robitussin cough syrup. Tesslon perles have not helped. She uses flutter valve three times a day. She has been taking Anoro as directed. She is very reluctant to start ICS. She has 4 days left of current prednisone taper. She wears 2l oxygen at night. She is due CT chest is scheduled for January.    02/07/2021- Interim hx  Patient presents today for 2 week follow-up. Treated for prolonged asthma exacerbation at last visit with Augmentin and prednisone taper. We changed her from Anoro to [place] as she was missing ICS component. She is doing a great deal better. She is less short winded and cough has also improved. She is no longer having incontinence from coughing. She is sleeping on one pillow at night. No acute symptoms.    Allergies  Allergen Reactions   Butalbital Hives    Other reaction(s): Other (See Comments) unknown   Sporanos [Itraconazole] Hives and Rash    Welts full body    Immunization History  Administered Date(s) Administered   Fluad Quad(high Dose 65+) 02/07/2021   Influenza,inj,Quad PF,6+ Mos 02/05/2018   Pneumococcal Polysaccharide-23 02/05/2018    Past Medical  History:  Diagnosis Date   Anxiety    Depression    GERD (gastroesophageal reflux disease)    Hypertension    Hypothyroidism    followed by pcp   Migraines    OA (osteoarthritis)    knees   Pneumonia    hx of    PONV (postoperative nausea and vomiting)    woke up during knee surgery    Wears glasses     Tobacco History: Social History   Tobacco Use  Smoking Status Former   Years: 10.00   Types: Cigarettes   Quit date: 05/22/1977   Years since quitting: 43.7  Smokeless Tobacco Never   Counseling given: Not Answered   Outpatient Medications Prior to Visit  Medication Sig Dispense Refill   almotriptan (AXERT) 12.5 MG tablet Take 12.5 mg by mouth as needed for migraine. may repeat in 2 hours if needed     ALPRAZolam (XANAX) 1 MG tablet Take 1 mg by mouth at bedtime.     amLODipine (NORVASC) 2.5 MG tablet Take 2.5 mg by mouth daily.     benzonatate (TESSALON) 200 MG capsule Take 1 capsule (200 mg total) by mouth 3 (three) times daily as needed for cough. 30 capsule 1   Biotin 5000 MCG TABS Take 5,000 mcg by mouth daily.      Brimonidine Tartrate (LUMIFY) 0.025 % SOLN Place 1 drop into both eyes daily as needed (dry eyes).     cholecalciferol (VITAMIN D3) 25 MCG (1000 UT) tablet Take 1,000 Units by mouth daily.  diphenhydrAMINE (BENADRYL) 25 MG tablet Take 25 mg by mouth daily as needed for allergies.     esomeprazole (NEXIUM) 20 MG capsule Take 20 mg by mouth daily as needed.      estrogens, conjugated, (PREMARIN) 0.9 MG tablet Take 0.9 mg by mouth daily.      fluconazole (DIFLUCAN) 100 MG tablet Take 1 tablet (100 mg total) by mouth daily. 7 tablet 0   lamoTRIgine (LAMICTAL) 150 MG tablet Take 150 mg by mouth 2 (two) times daily.     levothyroxine (SYNTHROID, LEVOTHROID) 25 MCG tablet Take 25 mcg by mouth daily before breakfast.     Misc Natural Products (OSTEO BI-FLEX TRIPLE STRENGTH PO) Take 1 tablet by mouth daily.      nystatin (MYCOSTATIN) 100000 UNIT/ML suspension  Take 5 mLs (500,000 Units total) by mouth 4 (four) times daily. 120 mL 1   olmesartan-hydrochlorothiazide (BENICAR HCT) 40-25 MG tablet Take 1 tablet by mouth daily.     albuterol (VENTOLIN HFA) 108 (90 Base) MCG/ACT inhaler SMARTSIG:2 Puff(s) By Mouth Every 4-6 Hours PRN 24 g 3   amoxicillin-clavulanate (AUGMENTIN) 875-125 MG tablet Take 1 tablet by mouth 2 (two) times daily. 14 tablet 0   Budeson-Glycopyrrol-Formoterol (BREZTRI AEROSPHERE) 160-9-4.8 MCG/ACT AERO Inhale 160 mcg into the lungs 2 (two) times daily. 5.9 g 0   predniSONE (DELTASONE) 10 MG tablet Take 4 tabs po daily x 5 days; then 2 tabs daily x5 days; then 1 tab daily x 5 days; then stop 35 tablet 0   umeclidinium-vilanterol (ANORO ELLIPTA) 62.5-25 MCG/INH AEPB Inhale 1 puff into the lungs daily. (Patient not taking: Reported on 02/07/2021) 1 each 11   No facility-administered medications prior to visit.    Review of Systems  Review of Systems  Constitutional: Negative.   HENT: Negative.    Respiratory: Negative.    Cardiovascular: Negative.     Physical Exam  BP 110/68 (BP Location: Right Arm, Cuff Size: Normal)   Pulse 74   Ht 5' 2.5" (1.588 m)   Wt 178 lb 9.6 oz (81 kg)   SpO2 97%   BMI 32.15 kg/m  Physical Exam Constitutional:      Appearance: Normal appearance.  HENT:     Head: Normocephalic and atraumatic.     Mouth/Throat:     Mouth: Mucous membranes are moist.     Pharynx: Oropharynx is clear.  Cardiovascular:     Rate and Rhythm: Normal rate and regular rhythm.  Pulmonary:     Effort: Pulmonary effort is normal.     Breath sounds: Normal breath sounds. No wheezing.  Musculoskeletal:        General: Normal range of motion.  Skin:    General: Skin is warm and dry.  Neurological:     General: No focal deficit present.     Mental Status: She is alert and oriented to person, place, and time. Mental status is at baseline.  Psychiatric:        Mood and Affect: Mood normal.        Behavior: Behavior  normal.        Thought Content: Thought content normal.        Judgment: Judgment normal.     Lab Results:  CBC    Component Value Date/Time   WBC 11.6 (H) 07/14/2020 0321   RBC 3.55 (L) 07/14/2020 0321   HGB 11.6 (L) 07/14/2020 0321   HCT 34.5 (L) 07/14/2020 0321   PLT 132 (L) 07/14/2020 0321   MCV 97.2  07/14/2020 0321   MCH 32.7 07/14/2020 0321   MCHC 33.6 07/14/2020 0321   RDW 12.2 07/14/2020 0321   LYMPHSABS 2.8 06/06/2018 1054   MONOABS 0.9 06/06/2018 1054   EOSABS 0.2 06/06/2018 1054   BASOSABS 0.1 06/06/2018 1054    BMET    Component Value Date/Time   NA 135 07/14/2020 0321   K 4.0 07/14/2020 0321   CL 100 07/14/2020 0321   CO2 26 07/14/2020 0321   GLUCOSE 145 (H) 07/14/2020 0321   BUN 15 07/14/2020 0321   CREATININE 0.81 07/14/2020 0321   CALCIUM 9.0 07/14/2020 0321   GFRNONAA >60 07/14/2020 0321   GFRAA >60 06/13/2018 0530    BNP No results found for: BNP  ProBNP No results found for: PROBNP  Imaging: DG Chest 2 View  Result Date: 01/20/2021 CLINICAL DATA:  Shortness of breath. EXAM: CHEST - 2 VIEW COMPARISON:  Chest x-ray dated October 28, 2020. FINDINGS: The heart size and mediastinal contours are within normal limits. Both lungs are clear. The visualized skeletal structures are unremarkable. IMPRESSION: No active cardiopulmonary disease. Electronically Signed   By: Obie Dredge M.D.   On: 01/20/2021 16:08     Assessment & Plan:   Asthma with COPD with exacerbation (HCC) - Resolved. Patient is prone to recurrent exacerbations require multiple rounds of prednisone. ICS was added back in October 2022 and symptoms improved. Continue Breztri Aerosphere 1-2 puffs morning and evening. May cosnider de-esclate therapy to low-medium dose ICS/LABA at next visit. No obstruction on PFTs. FU in 3 months   Abnormal CT of the chest - CT chest in September 2022 showed minimal patchy ground-glass in the left upper lobe. May represent resolving pneumonia vs  noninfectious pneumonia vs malignancy.  - Plan follow-up imaging scheduled for January 2023  Oral thrush - At risk for oral thursh, RX Nystatin 70ml QID to have on hand      Glenford Bayley, NP 02/07/2021

## 2021-02-07 NOTE — Assessment & Plan Note (Signed)
-   At risk for oral thursh, RX Nystatin 68ml QID to have on hand

## 2021-02-07 NOTE — Patient Instructions (Addendum)
So glad you are doing better   Recommendations: - Continue Breztri Aerosphere 1-2 puffs morning and evening (rinse mouth after use) - CT chest in January 2023  - Ok to get covid booster shot any time   Orders: - High dose flu shot today   Rx: - [facility]tri [facility]   Follow-up: - 3 months with Waynetta Sandy NP (Beginning of February)

## 2021-02-07 NOTE — Assessment & Plan Note (Signed)
-   CT chest in September 2022 showed minimal patchy ground-glass in the left upper lobe. May represent resolving pneumonia vs noninfectious pneumonia vs malignancy.  - Plan follow-up imaging scheduled for January 2023

## 2021-02-08 ENCOUNTER — Telehealth: Payer: Self-pay | Admitting: Primary Care

## 2021-02-08 NOTE — Telephone Encounter (Signed)
Rx for pt's albuterol inhaler was sent to [facility] after pt's OV yesterday with [facility] 11/7 and it looks like it was sent as a 18-month supply. Called and spoke with pt letting her know this had been done and she stated she was going to call [facility]. Nothing further needed.

## 2021-02-10 ENCOUNTER — Ambulatory Visit: Payer: PRIVATE HEALTH INSURANCE | Admitting: Student

## 2021-02-17 ENCOUNTER — Telehealth: Payer: Self-pay | Admitting: Primary Care

## 2021-02-17 MED ORDER — PREDNISONE 10 MG PO TABS: ORAL_TABLET | ORAL | 0 refills | Status: DC

## 2021-02-17 NOTE — Telephone Encounter (Signed)
Call made to patient, confirmed DOB. She reports she recently got the Flu shot on 02/07/21. She reports having increased wheezing and cough with clear mucous since getting the FLU shot. She feels like the symptoms are getting worse.  She confirms she is using the [place] daily and using her albuterol about 3x/day. Denies fevers, body aches, chills, sweats, or GI upset. She states the tessalon pearls did not work in the past. She reports no cough syrup has helped either. Denies having tried anything else OTC.   RA please advise. Thanks :)

## 2021-02-17 NOTE — Telephone Encounter (Signed)
Oretha Milch, MD to Kerin Ransom, LPN  Glenford Bayley, NP     4:39 PM  Recent asthma exacerbation treated with prednisone taper, last seen BW on 11/7   Please send in prescription for  Prednisone 10 mg tabs  Take 2 tabs daily with food x 5ds, then 1 tab daily with food x 5ds then STOP    I called and spoke with the pt and notified of response per Dr Vassie Loll. Pt verbalized understanding and I have sent rxs to pharm.

## 2021-02-22 NOTE — Telephone Encounter (Signed)
Seems like encounter was open in error so closing encounter.  

## 2021-03-02 ENCOUNTER — Ambulatory Visit: Payer: PRIVATE HEALTH INSURANCE | Admitting: Student

## 2021-03-29 ENCOUNTER — Telehealth: Payer: Self-pay | Admitting: Student

## 2021-03-29 MED ORDER — DOXYCYCLINE HYCLATE 100 MG PO TABS: 100.0000 mg | ORAL_TABLET | Freq: Two times a day (BID) | ORAL | 0 refills | Status: DC

## 2021-03-29 MED ORDER — PREDNISONE 10 MG PO TABS: ORAL_TABLET | ORAL | 0 refills | Status: DC

## 2021-03-29 NOTE — Telephone Encounter (Signed)
Called and spoke with patient. She verbalized understanding of recommendations. RXs have been sent to her pharmacy. Nothing further needed at time of call.  

## 2021-03-29 NOTE — Telephone Encounter (Signed)
I called the patient and she is having some shortness of breath x 1-2 weeks. She is having a productive  that is dark green to yellow.  She is taking her Breztri as prescribed. She feels more short of breath when walking. She is not sure if she needs something for this such as an ABT or if she is having a flare up.   She has not been exposed to anyone that has been sick and she took a test yesterday that was negative. Please advise.

## 2021-03-29 NOTE — Telephone Encounter (Signed)
I would treat her for an acute flare:  Pred > Take 40mg  daily for 3 days, then 30mg  daily for 3 days, then 20mg  daily for 3 days, then 10mg  daily for 3 days, then stop Doxycycline 100mg  bid x 7 days

## 2021-04-15 ENCOUNTER — Other Ambulatory Visit: Payer: Self-pay

## 2021-04-15 ENCOUNTER — Ambulatory Visit (HOSPITAL_BASED_OUTPATIENT_CLINIC_OR_DEPARTMENT_OTHER)
Admission: RE | Admit: 2021-04-15 | Discharge: 2021-04-15 | Disposition: A | Discharge status: Home / Self Care | Payer: Medicare Other | Source: Ambulatory Visit | Attending: Student | Admitting: Student

## 2021-04-15 DIAGNOSIS — R918 Other nonspecific abnormal finding of lung field: Secondary | ICD-10-CM | POA: Diagnosis present

## 2021-05-06 ENCOUNTER — Telehealth: Payer: Self-pay | Admitting: Student

## 2021-05-06 MED ORDER — AZITHROMYCIN 250 MG PO TABS: ORAL_TABLET | ORAL | 0 refills | Status: DC

## 2021-05-06 MED ORDER — PREDNISONE 20 MG PO TABS: 10.0000 mg | ORAL_TABLET | Freq: Every day | ORAL | 0 refills | Status: DC

## 2021-05-06 NOTE — Telephone Encounter (Signed)
Called patient and she states that for the past few days she has been Coughing, chest hurts, Mucus production with dark yellow, no fever noted, mild headache noted. And some wheezing noted. Per patient this just started a few days ago. And she feels like she is getting worse. She would like something sent in if this is possible.   Dr [name] please advise

## 2021-05-06 NOTE — Telephone Encounter (Signed)
Spoke with pt and notified her meds were call in by Dr. [name]. Pt stated understanding. Nothing further needed at this time.

## 2021-05-06 NOTE — Telephone Encounter (Signed)
Prescriptions sent for 5d course prednisone, azithromycin

## 2021-05-10 ENCOUNTER — Other Ambulatory Visit: Payer: Self-pay

## 2021-05-10 ENCOUNTER — Ambulatory Visit (INDEPENDENT_AMBULATORY_CARE_PROVIDER_SITE_OTHER): Payer: Medicare Other | Admitting: Primary Care

## 2021-05-10 ENCOUNTER — Encounter: Payer: Self-pay | Admitting: Primary Care

## 2021-05-10 VITALS — BP 122/76 | HR 70 | Temp 97.7°F | Ht 62.0 in | Wt 173.2 lb

## 2021-05-10 DIAGNOSIS — B370 Candidal stomatitis: Secondary | ICD-10-CM | POA: Diagnosis not present

## 2021-05-10 DIAGNOSIS — R9389 Abnormal findings on diagnostic imaging of other specified body structures: Secondary | ICD-10-CM | POA: Diagnosis not present

## 2021-05-10 DIAGNOSIS — J45901 Unspecified asthma with (acute) exacerbation: Secondary | ICD-10-CM | POA: Diagnosis not present

## 2021-05-10 DIAGNOSIS — R918 Other nonspecific abnormal finding of lung field: Secondary | ICD-10-CM

## 2021-05-10 DIAGNOSIS — J441 Chronic obstructive pulmonary disease with (acute) exacerbation: Secondary | ICD-10-CM

## 2021-05-10 LAB — CBC WITH DIFFERENTIAL/PLATELET
Basophils Absolute: 0 10*3/uL (ref 0.0–0.1)
Basophils Relative: 0.4 % (ref 0.0–3.0)
Eosinophils Absolute: 0.1 10*3/uL (ref 0.0–0.7)
Eosinophils Relative: 0.7 % (ref 0.0–5.0)
HCT: 42 % (ref 36.0–46.0)
Hemoglobin: 14 g/dL (ref 12.0–15.0)
Lymphocytes Relative: 21.7 % (ref 12.0–46.0)
Lymphs Abs: 2.3 10*3/uL (ref 0.7–4.0)
MCHC: 33.3 g/dL (ref 30.0–36.0)
MCV: 94.4 fl (ref 78.0–100.0)
Monocytes Absolute: 0.7 10*3/uL (ref 0.1–1.0)
Monocytes Relative: 6.4 % (ref 3.0–12.0)
Neutro Abs: 7.3 10*3/uL (ref 1.4–7.7)
Neutrophils Relative %: 70.8 % (ref 43.0–77.0)
Platelets: 211 10*3/uL (ref 150.0–400.0)
RBC: 4.45 Mil/uL (ref 3.87–5.11)
RDW: 13.6 % (ref 11.5–15.5)
WBC: 10.4 10*3/uL (ref 4.0–10.5)

## 2021-05-10 NOTE — Assessment & Plan Note (Addendum)
-   Repeat CT chest wo contrast on 04/15/20 showed resolution left upper lobe GGO, new area GGO measuring 1mm RUL felt to reflect focal area of inflammation. She needs repeat CT chest imaging in 3 months to monitor

## 2021-05-10 NOTE — Assessment & Plan Note (Addendum)
-   Currently being treated for acute exacerbation with zpack and prednisone 20mg  x 5 days. She reports improvement in cough/chest congestion. Her symptoms typically flare with weather. Recommend checking resp allergy panel and CBC with diff along with sputum sample. Consider adding Singulair 10mg  at bedtime. Continue Breztri aeropshere 2 puffs twice daily and prn albuterol hfa/neb q 6 hours for breakthrough sob/wheezing.

## 2021-05-10 NOTE — Patient Instructions (Addendum)
CT chest showed waxing and waning areas of ground glass opacities, this could be from infection, inflammation. Need repeat CT chest in 3 months to ensure area is not enlarging d.t smoking hx  Recommendations: Continue Breztri two puffs morning and evening Use albuterol rescue inhaler or nebulizer every 6 hours for breakthrough shortness of breath/wheezing Continue zpack and prednisone until complete  Orders: Labs today (ordered) Sputum culture x1 (ordered) CT chest wo contrast in 3 months (ordered)  Rx: Albuterol neb solution  Follow-up: 3 months with Dr. Charlie Pitter (only) after CT chest

## 2021-05-10 NOTE — Assessment & Plan Note (Signed)
-   Stable; using prn nystatin for thrush symptoms with relief

## 2021-05-10 NOTE — Progress Notes (Signed)
 @Patient  ID: Kathryn Hicks, female    DOB: 1955-11-23, 66 y.o.   MRN: [id number]  Chief Complaint  Patient presents with   Follow-up    Patient says she's starting to have a flare up.     Referring provider: Marylen PontoHolt, Lynley S, MD  HPI: [id number] year old female, former smoker + secondary hand exposure quit in 1979 (10 pack year hx). PMH significant for COPD-asthma over lap, acute on chronic respiratory failure. Patient of Dr. Thora LanceMeier, recently seen on 01/19/21. Maintained on Anoro, recommended adding ICS but patient was reluctant d/t thrush.   Previous [facility] pulmonary encounter:  01/24/2021 Patient presents today for acute visit. She has had a productive cough with associated chest tightness and wheezing for 4 months. Coughing spells occur early in the morning which will last 30-2960min. Cough is productive with dark green-brown mucus. She has tried delsym and robitussin cough syrup. Tesslon perles have not helped. She uses flutter valve three times a day. She has been taking Anoro as directed. She is very reluctant to start ICS. She has 4 days left of current prednisone taper. She wears 2l oxygen at night. She is due CT chest is scheduled for January.   02/07/2021 Patient presents today for 2 week follow-up. Treated for prolonged asthma exacerbation at last visit with Augmentin and prednisone taper. We changed her from Anoro to HowellBreztri as she was missing ICS component. She is doing a great deal better. She is less short winded and cough has also improved. She is no longer having incontinence from coughing. She is sleeping on one pillow at night. No acute symptoms.   05/10/2021- Interim hx  Patient presents today for routine 3 month follow-up. She called our office 4 days ago with acute symptoms of cough with mucus production. She was sent in RX zpack and prednisone 20mg 67 x 5 days. She is feeling better. Her symptoms flare during weather change. She is compliant with Breztri Aerosphere two puffs twice daily.  Uses nystatin as needed for thrush symptoms. CT chest wo contrast on 04/15/20 showed resolution left upper lobe GGO, new area GGO measuring 9mm RUL.   Asthma/COPD exacerbations:  Acute flare 11/17- pred Acute flare 12/27- doxy and pred Acute flare 05/06/21- zpack and pred x 5 days   Allergies  Allergen Reactions   Butalbital Hives    Other reaction(s): Other (See Comments) unknown   Sporanos [Itraconazole] Hives and Rash    Welts full body    Immunization History  Administered Date(s) Administered   Fluad Quad(high Dose 65+) 02/07/2021   Influenza,inj,Quad PF,6+ Mos 02/05/2018   Moderna Sars-Covid-2 Vaccination 06/12/2019, 07/17/2019, 01/26/2020   PFIZER(Purple Top)SARS-COV-2 Vaccination 03/02/2021   Pneumococcal Polysaccharide-23 02/05/2018    Past Medical History:  Diagnosis Date   Anxiety    Depression    GERD (gastroesophageal reflux disease)    Hypertension    Hypothyroidism    followed by pcp   Migraines    OA (osteoarthritis)    knees   Pneumonia    hx of    PONV (postoperative nausea and vomiting)    woke up during knee surgery    Wears glasses     Tobacco History: Social History   Tobacco Use  Smoking Status Former   Years: 10.00   Types: Cigarettes   Quit date: 05/22/1977   Years since quitting: 43.9  Smokeless Tobacco Never   Counseling given: Not Answered   Outpatient Medications Prior to Visit  Medication Sig Dispense Refill   albuterol (VENTOLIN  HFA) 108 (90 Base) MCG/ACT inhaler SMARTSIG:2 Puff(s) By Mouth Every 4-6 Hours PRN 24 g 3   almotriptan (AXERT) 12.5 MG tablet Take 12.5 mg by mouth as needed for migraine. may repeat in 2 hours if needed     ALPRAZolam (XANAX) 1 MG tablet Take 1 mg by mouth at bedtime.     amLODipine (NORVASC) 2.5 MG tablet Take 2.5 mg by mouth daily.     azithromycin (ZITHROMAX) 250 MG tablet Two tablets today, then 1 tablet daily till gone 6 tablet 0   benzonatate (TESSALON) 200 MG capsule Take 1 capsule (200 mg  total) by mouth 3 (three) times daily as needed for cough. 30 capsule 1   Biotin 5000 MCG TABS Take 5,000 mcg by mouth daily.      Brimonidine Tartrate (LUMIFY) 0.025 % SOLN Place 1 drop into both eyes daily as needed (dry eyes).     Budeson-Glycopyrrol-Formoterol (BREZTRI AEROSPHERE) 160-9-4.8 MCG/ACT AERO Inhale 2 puffs into the lungs in the morning and at bedtime. 32.1 g 3   cholecalciferol (VITAMIN D3) 25 MCG (1000 UT) tablet Take 1,000 Units by mouth daily.     diphenhydrAMINE (BENADRYL) 25 MG tablet Take 25 mg by mouth daily as needed for allergies.     esomeprazole (NEXIUM) 20 MG capsule Take 20 mg by mouth daily as needed.      estrogens, conjugated, (PREMARIN) 0.9 MG tablet Take 0.9 mg by mouth daily.      fluconazole (DIFLUCAN) 100 MG tablet Take 1 tablet (100 mg total) by mouth daily. 7 tablet 0   lamoTRIgine (LAMICTAL) 150 MG tablet Take 150 mg by mouth 2 (two) times daily.     levothyroxine (SYNTHROID, LEVOTHROID) 25 MCG tablet Take 25 mcg by mouth daily before breakfast.     Misc Natural Products (OSTEO BI-FLEX TRIPLE STRENGTH PO) Take 1 tablet by mouth daily.      nystatin (MYCOSTATIN) 100000 UNIT/ML suspension Take 5 mLs (500,000 Units total) by mouth 4 (four) times daily. 120 mL 1   olmesartan-hydrochlorothiazide (BENICAR HCT) 40-25 MG tablet Take 1 tablet by mouth daily.     predniSONE (DELTASONE) 20 MG tablet Take 0.5 tablets (10 mg total) by mouth daily with breakfast. 10 tablet 0   doxycycline (VIBRA-TABS) 100 MG tablet Take 1 tablet (100 mg total) by mouth 2 (two) times daily. (Patient not taking: Reported on 05/10/2021) 14 tablet 0   No facility-administered medications prior to visit.    Review of Systems  Review of Systems  Constitutional: Negative.   HENT:  Negative for congestion.   Respiratory:  Positive for cough. Negative for wheezing.     Physical Exam  BP 122/76 (BP Location: Right Arm, Patient Position: Sitting, Cuff Size: Normal)    Pulse 70    Temp 97.7  F (36.5 C) (Oral)    Ht 5\' 2"  (1.575 m)    Wt 173 lb 3.2 oz (78.6 kg)    SpO2 96%    BMI 31.68 kg/m  Physical Exam Constitutional:      Appearance: Normal appearance.  HENT:     Head: Normocephalic and atraumatic.  Cardiovascular:     Rate and Rhythm: Normal rate and regular rhythm.  Pulmonary:     Effort: Pulmonary effort is normal.     Breath sounds: Normal breath sounds. No rhonchi or rales.     Comments: Faint insp wheeze upper lobes bilaterally  Skin:    General: Skin is warm and dry.  Neurological:  General: No focal deficit present.     Mental Status: She is alert and oriented to person, place, and time. Mental status is at baseline.  Psychiatric:        Mood and Affect: Mood normal.        Behavior: Behavior normal.        Thought Content: Thought content normal.        Judgment: Judgment normal.     Lab Results:  CBC    Component Value Date/Time   WBC 10.4 05/10/2021 1137   RBC 4.45 05/10/2021 1137   HGB 14.0 05/10/2021 1137   HCT 42.0 05/10/2021 1137   PLT 211.0 05/10/2021 1137   MCV 94.4 05/10/2021 1137   MCH 32.7 07/14/2020 0321   MCHC 33.3 05/10/2021 1137   RDW 13.6 05/10/2021 1137   LYMPHSABS 2.3 05/10/2021 1137   MONOABS 0.7 05/10/2021 1137   EOSABS 0.1 05/10/2021 1137   BASOSABS 0.0 05/10/2021 1137    BMET    Component Value Date/Time   NA 135 07/14/2020 0321   K 4.0 07/14/2020 0321   CL 100 07/14/2020 0321   CO2 26 07/14/2020 0321   GLUCOSE 145 (H) 07/14/2020 0321   BUN 15 07/14/2020 0321   CREATININE 0.81 07/14/2020 0321   CALCIUM 9.0 07/14/2020 0321   GFRNONAA >60 07/14/2020 0321   GFRAA >60 06/13/2018 0530    BNP No results found for: BNP  ProBNP No results found for: PROBNP  Imaging: CT Chest Wo Contrast  Result Date: 04/15/2021 CLINICAL DATA:  Lung nodule, cough. EXAM: CT CHEST WITHOUT CONTRAST TECHNIQUE: Multidetector CT imaging of the chest was performed following the standard protocol without IV contrast. RADIATION  DOSE REDUCTION: This exam was performed according to the departmental dose-optimization program which includes automated exposure control, adjustment of the mA and/or kV according to patient size and/or use of iterative reconstruction technique. COMPARISON:  December 28, 2020. FINDINGS: Cardiovascular: Atherosclerosis of thoracic aorta is noted without aneurysm formation. Normal cardiac size. No pericardial effusion. Mediastinum/Nodes: No enlarged mediastinal or axillary lymph nodes. Thyroid gland, trachea, and esophagus demonstrate no significant findings. Lungs/Pleura: No pneumothorax or pleural effusion is noted. Ground-glass opacity seen in left upper lobe on prior exam is not visualized currently and presumably represented focal inflammation. There is now noted new ground-glass opacity measuring 9 mm posteriorly in right upper lobe along major fissure best seen on image number 42 of series 4. Upper Abdomen: Nodular hepatic contours are noted again noted suggesting hepatic cirrhosis. Musculoskeletal: No chest wall mass or suspicious bone lesions identified. IMPRESSION: Ground-glass opacity seen in left upper lobe on prior exam is not visualized currently presumably represented focal inflammation. There is now noted new ground-glass opacity measuring 9 mm posteriorly in right upper lobe which may represent focal inflammation, but neoplasm cannot be excluded. Initial follow-up by chest CT without contrast is recommended in 3 months to confirm persistence. This recommendation follows the consensus statement: Recommendations for the Management of Subsolid Pulmonary Nodules Detected at CT: A Statement from the Fleischner Society as published in Radiology 2013; 266:304-317. Findings consistent with hepatic cirrhosis. Aortic Atherosclerosis (ICD10-I70.0). Electronically Signed   By: Lupita Raider M.D.   On: 04/15/2021 10:27     Assessment & Plan:   Asthma with COPD with exacerbation (HCC) - Currently being  treated for acute exacerbation with zpack and prednisone 20mg 67 x 5 days. She reports improvement in cough/chest congestion. Her symptoms typically flare with weather. Recommend checking resp allergy panel and CBC with  diff. Consider adding Singulair 10mg  at bedtime. Continue Breztri aeropshere 2 puffs twice daily and prn albuterol hfa/neb q 6 hours for breakthrough sob/wheezing.   Oral thrush - Stable; using prn nystatin for thrush symptoms with relief   Abnormal CT of the chest - Repeat CT chest wo contrast on 04/15/20 showed resolution left upper lobe GGO, new area GGO measuring 19mm RUL felt to reflect focal area of inflammation. She needs repeat CT chest imaging in 3 months to monitor      Glenford Bayley, NP 05/10/2021

## 2021-05-11 LAB — RESPIRATORY ALLERGY PROFILE REGION II ~~LOC~~

## 2021-05-11 LAB — INTERPRETATION:

## 2021-05-12 NOTE — Progress Notes (Signed)
Please let patient know her allergy testing was negative, ok to hold off on stating over the counter allergy medication or Singulair right now

## 2021-05-30 ENCOUNTER — Encounter: Payer: Medicare Other | Admitting: *Deleted

## 2021-05-30 ENCOUNTER — Other Ambulatory Visit: Payer: Self-pay

## 2021-05-30 DIAGNOSIS — Z006 Encounter for examination for normal comparison and control in clinical research program: Secondary | ICD-10-CM

## 2021-05-30 DIAGNOSIS — R918 Other nonspecific abnormal finding of lung field: Secondary | ICD-10-CM

## 2021-05-30 NOTE — Research (Signed)
Title: NIGHTINGALE: CliNIcal Utility of ManaGement of Patients witH CT and LDCT Identified Pulmonary Nodules UsinG the Percepta NasAL Swab ClassifiEr -- with Familiarization   Protocol #: [id number] Sponsor: [employer]   Protocol Revision 1 dated 01Sep2022 and confirmed current on today's visit, IRB approved Revision 1 on 29Dec2022.   Objectives:  Primary: To evaluate if use of the Percepta Nasal Swab test in the diagnostic work up of newly identified pulmonary nodules reduces the number of invasive procedures in the group classified as low-risk by the test and that are benign as compared to a control group managed without a Percepta Nasal Swab test result.                   A newly identified nodule is defined as any nodule first identified on imaging                   <90 days prior to nasal sample collection that hasnt undergone a diagnostic                    procedure for the management of their index nodule prior to enrollment.                   CT imaging includes conventional CT, LDCT, HRCT                   Benign diagnosis is defined as a specific diagnosis of a benign condition,                    radiographic resolution or stability at ? 24 months, or no cytological,                    radiological, or pathological evidence of cancer.                   Procedures will be categorized as either invasive or non-invasive in the Data                    Management Plan (DMP). Secondary: To evaluate if use of the Percepta Nasal Swab test in the diagnostic work up of newly identified pulmonary nodules increases the proportion of subjects classified as high-risk by the test and have primary lung cancer that go directly to appropriate therapy as compared to a control group managed without a Percepta Nasal Swab test result.                    Proportion of subjects that go directly to appropriate therapy is defined as those                     subjects that undergo surgery, ablative  or other appropriate therapy as the next                     step after the Percepta Nasal Swab test result without intervening non-surgical                     procedures                             a. Non-surgical procedures include diagnostic PET, but not PET for  staging purposes.                             b. Appropriate therapies will be defined in the CRF.                    A newly identified nodule is defined as any nodule first identified on imaging                    <90 days prior to nasal sample collection.                    Lung cancer diagnosis is defined as established by cytology or pathology, or in                     circumstances where a presumptive diagnosis of cancer led to definitive                     ablative or other appropriate therapy without pathology. Key Inclusion Criteria:  Inclusion Criteria:  Able to tolerate nasal epithelial specimen collection  Signed written Informed Consent obtained  Subject clinical history available for review by sponsor and regulatory agencies  New nodule first identified on imaging < 90 days prior to nasal sample collection (index nodule)  CT report available for index nodule  16 - 72 years of age  Current or former smoker (>100 cigarettes in a lifetime)  Pulmonary nodule ?30 mm detected by CT  Key Exclusion Criteria: Exclusion Criteria  Subject has undergone a diagnostic procedure for the management of their index nodule after the index CT and prior to enrollment  Active cancer (other than non-melanoma skin cancer)  Prior primary lung cancer (prior non-lung cancer acceptable)  Prior participation in this study (i.e., subjects may not be enrolled more than once)  Current active treatment with an investigational device or drug (patients in trial follow up period are okay if intervention phase is complete)  Patient enrolled or planned to be enrolled in another clinical trial that may influence  management of the patients nodule  Concurrent or planned use of tools or tests for assigning lung nodule risk of malignancy (e.g., genomic or proteomic blood tests) other than clinically validated risk calculators  Clinical Research Coordinator / Research RN note : This visit is for Kathryn Hicks Subject [id number] with DOB: 07-10-1955 on 05/30/2021 for the above protocol is an Enrollment Visit and is for purpose of research.    Subject expressed interest and consent in continuing as a study subject. Subject confirmed contact information (e.g. address, telephone, email). Subject thanked for participation in research and contribution to science.     During this visit on 05/30/2021, the subject reviewed and signed the consent form, provided demographics, and had a nasal swab collected per the above referenced protocol. Please refer to the subject's paper source binder for further details.   The PI and Sub-I met/discussed the subject prior to consenting patient.  The sub-Investigator Rexene Edison, NP was present for the consenting process.         Signed by [place] 03:55 PM 05/30/2021

## 2021-06-07 ENCOUNTER — Other Ambulatory Visit: Payer: Self-pay | Admitting: Primary Care

## 2021-06-08 ENCOUNTER — Telehealth: Payer: Self-pay | Admitting: Primary Care

## 2021-06-08 MED ORDER — ALBUTEROL SULFATE HFA 108 (90 BASE) MCG/ACT IN AERS: INHALATION_SPRAY | RESPIRATORY_TRACT | 3 refills | Status: DC

## 2021-06-08 NOTE — Telephone Encounter (Signed)
I called the patient and let her know that a refill was sent to the pharmacy. Nothing further needed.  ?

## 2021-06-08 NOTE — Telephone Encounter (Signed)
Beth, please advise on med refill. 

## 2021-08-05 ENCOUNTER — Ambulatory Visit (HOSPITAL_BASED_OUTPATIENT_CLINIC_OR_DEPARTMENT_OTHER)
Admission: RE | Admit: 2021-08-05 | Discharge: 2021-08-05 | Disposition: A | Discharge status: Home / Self Care | Payer: Medicare Other | Source: Ambulatory Visit | Attending: Family Medicine | Admitting: Family Medicine

## 2021-08-05 DIAGNOSIS — R918 Other nonspecific abnormal finding of lung field: Secondary | ICD-10-CM | POA: Diagnosis present

## 2021-08-08 NOTE — Progress Notes (Addendum)
? ?Synopsis: Referred for COPD with exacerbation by Marylen Ponto, MD ? ?Subjective:  ? ?PATIENT ID: Kathryn Hicks GENDER: female DOB: 04-03-1956, MRN: [id number] ? ?Chief Complaint  ?Patient presents with  ? Follow-up  ?  Breathing has improved since her last appt. She is using her albuterol inhaler about once per day now.   ? ?She has history of COPD, GERD, smoking 8py in distant past with a lot secondhand exposure and some woodstove smoke exposure, hypothyroid and is referred for COPD with exacerbation.  ? ?4 months ago with severe cough, especially over last 6 weeks. Has been to urgent care x3, ED x1. Feels better when she gets steroids. It is productive cough. No fever throughout. 3 rounds of ABX (clindamcyin and a couple of others). Did take fluconazole as well. Only feels dyspneic when she is coughing. Only has chest pain when she's been coughing. ? ?Interval HPI: ?Sticking with breztri, CT Chest with resolution of ggos.  ? ?Hasn't needed course of steroids or ABX since last visit. Occasional DOE when she gets upset or when she's leaning down. Takes albuterol and it improves. Typically has accompanying chest tightness, not throat tightness. She does get raspy voice when she has chest tightness.  ? ?Otherwise pertinent review of systems is negative. ? ?Past Medical History:  ?Diagnosis Date  ? Anxiety   ? Depression   ? GERD (gastroesophageal reflux disease)   ? Hypertension   ? Hypothyroidism   ? followed by pcp  ? Migraines   ? OA (osteoarthritis)   ? knees  ? Pneumonia   ? hx of   ? PONV (postoperative nausea and vomiting)   ? woke up during knee surgery   ? Wears glasses   ?  ? ?Family History  ?Problem Relation Age of Onset  ? Prostate cancer Father   ? Heart attack Father   ? Wilm's tumor Daughter   ?  ? ?Past Surgical History:  ?Procedure Laterality Date  ? ABDOMINAL HYSTERECTOMY  2003  ?  w/  Bilateral Salpingo-oophorectomy  ? CARPAL TUNNEL RELEASE Left 2014  ? CESAREAN SECTION  x2  last one 1981  ?  COLONOSCOPY    ? KNEE ARTHROSCOPY Left 06-21-2017   dr Darrelyn Hillock  ? LAPAROSCOPIC CHOLECYSTECTOMY  2004  ? 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  ? Procedure: TOTAL KNEE ARTHROPLASTY;  Surgeon: [name], MD;  Location: [facility];  Service: Orthopedics;  Laterality: Left;   ? TOTAL KNEE ARTHROPLASTY Right 07/13/2020  ? Procedure: TOTAL KNEE ARTHROPLASTY;  Surgeon: Durene Romans, MD;  Location: [facility];  Service: Orthopedics;  Laterality: Right;  70 mins  ? ? ?Social History  ? ?Socioeconomic History  ? Marital status: Married  ?  Spouse name: Not on file  ? Number of children: Not on file  ? Years of education: Not on file  ? Highest education level: Not on file  ?Occupational History  ? Not on file  ?Tobacco Use  ? Smoking status: Former  ?  Years: 10.00  ?  Types: Cigarettes  ?  Quit date: 05/22/1977  ?  Years since quitting: 44.2  ? Smokeless tobacco: Never  ?Vaping Use  ? Vaping Use: Never used  ?Substance and Sexual Activity  ? Alcohol use: Never  ?  Comment: rare  ? Drug use: Never  ? Sexual activity: Not on file  ?Other Topics Concern  ? Not on file  ?Social History Narrative  ? Not on file  ? ?Social Determinants of  Health  ? ?Financial Resource Strain: Not on file  ?Food Insecurity: Not on file  ?Transportation Needs: Not on file  ?Physical Activity: Not on file  ?Stress: Not on file  ?Social Connections: Not on file  ?Intimate Partner Violence: Not on file  ?  ? ?Allergies  ?Allergen Reactions  ? Butalbital Hives  ?  Other reaction(s): Other (See Comments) ?unknown  ? Sporanos [Itraconazole] Hives and Rash  ?  Welts full body  ?  ? ?Outpatient Medications Prior to Visit  ?Medication Sig Dispense Refill  ? albuterol (VENTOLIN HFA) 108 (90 Base) MCG/ACT inhaler SMARTSIG:2 Puff(s) By Mouth Every 4-6 Hours PRN 24 g 3  ? almotriptan (AXERT) 12.5 MG tablet Take 12.5 mg by mouth as needed for migraine. may repeat in 2 hours if needed    ? ALPRAZolam (XANAX) 1 MG tablet Take 1 mg by mouth at bedtime.    ?  amLODipine (NORVASC) 2.5 MG tablet Take 2.5 mg by mouth daily.    ? Biotin 5000 MCG TABS Take 5,000 mcg by mouth daily.     ? Brimonidine Tartrate (LUMIFY) 0.025 % SOLN Place 1 drop into both eyes daily as needed (dry eyes).    ? Budeson-Glycopyrrol-Formoterol (BREZTRI AEROSPHERE) 160-9-4.8 MCG/ACT AERO Inhale 2 puffs into the lungs in the morning and at bedtime. 32.1 g 3  ? cholecalciferol (VITAMIN D3) 25 MCG (1000 UT) tablet Take 1,000 Units by mouth daily.    ? diphenhydrAMINE (BENADRYL) 25 MG tablet Take 25 mg by mouth daily as needed for allergies.    ? esomeprazole (NEXIUM) 20 MG capsule Take 20 mg by mouth daily as needed.     ? estrogens, conjugated, (PREMARIN) 0.9 MG tablet Take 0.9 mg by mouth daily.     ? lamoTRIgine (LAMICTAL) 150 MG tablet Take 150 mg by mouth 2 (two) times daily.    ? levothyroxine (SYNTHROID, LEVOTHROID) 25 MCG tablet Take 25 mcg by mouth daily before breakfast.    ? Misc Natural Products (OSTEO BI-FLEX TRIPLE STRENGTH PO) Take 1 tablet by mouth daily.     ? nystatin (MYCOSTATIN) 100000 UNIT/ML suspension TAKE 5 MLS BY MOUTH 4 TIMES DAILY. 120 mL 1  ? olmesartan-hydrochlorothiazide (BENICAR HCT) 40-25 MG tablet Take 1 tablet by mouth daily.    ? azithromycin (ZITHROMAX) 250 MG tablet Two tablets today, then 1 tablet daily till gone 6 tablet 0  ? benzonatate (TESSALON) 200 MG capsule Take 1 capsule (200 mg total) by mouth 3 (three) times daily as needed for cough. 30 capsule 1  ? doxycycline (VIBRA-TABS) 100 MG tablet Take 1 tablet (100 mg total) by mouth 2 (two) times daily. (Patient not taking: Reported on 05/10/2021) 14 tablet 0  ? fluconazole (DIFLUCAN) 100 MG tablet Take 1 tablet (100 mg total) by mouth daily. 7 tablet 0  ? predniSONE (DELTASONE) 20 MG tablet Take 0.5 tablets (10 mg total) by mouth daily with breakfast. 10 tablet 0  ? ?No facility-administered medications prior to visit.  ? ? ? ? ? ?Objective:  ? ?Physical Exam: ? ?General appearance: 66 y.o., female, NAD,  conversant  ?Eyes: anicteric sclerae, moist conjunctivae; no lid-lag; PERRL, tracking appropriately ?HENT: NCAT; oropharynx, MMM, no thrush ?Neck: Trachea midline; no lymphadenopathy, no JVD ?Lungs: CTAB, normal wob ?CV: RRR, no MRGs  ?Abdomen: Soft, non-tender; non-distended, BS present  ?Extremities: No peripheral edema, radial and DP pulses present bilaterally  ?Skin: Normal temperature, turgor and texture; no rash ?Psych: Appropriate affect ?Neuro: Alert and oriented to person  and place, no focal deficit  ? ? ?Vitals:  ? 08/10/21 1056  ?BP: 128/76  ?Pulse: 63  ?Temp: 98.3 ?F (36.8 ?C)  ?TempSrc: Oral  ?SpO2: 98%  ?Weight: 173 lb 12.8 oz (78.8 kg)  ?Height: 5\' 2"  (1.575 m)  ? ? ? ?98% on RA ?BMI Readings from Last 3 Encounters:  ?08/10/21 31.79 kg/m?  ?05/10/21 31.68 kg/m?  ?02/07/21 32.15 kg/m?  ? ?Wt Readings from Last 3 Encounters:  ?08/10/21 173 lb 12.8 oz (78.8 kg)  ?05/10/21 173 lb 3.2 oz (78.6 kg)  ?02/07/21 178 lb 9.6 oz (81 kg)  ? ? ? ?CBC ?   ?Component Value Date/Time  ? WBC 10.4 05/10/2021 1137  ? RBC 4.45 05/10/2021 1137  ? HGB 14.0 05/10/2021 1137  ? HCT 42.0 05/10/2021 1137  ? PLT 211.0 05/10/2021 1137  ? MCV 94.4 05/10/2021 1137  ? MCH 32.7 07/14/2020 0321  ? MCHC 33.3 05/10/2021 1137  ? RDW 13.6 05/10/2021 1137  ? LYMPHSABS 2.3 05/10/2021 1137  ? MONOABS 0.7 05/10/2021 1137  ? EOSABS 0.1 05/10/2021 1137  ? BASOSABS 0.0 05/10/2021 1137  ? ? ? ? ?Chest Imaging: ?CT Chest 12/28/20 with 2 areas of subtle patchy ggo, otherwise unremarkable ? ?CT Chest 08/05/21 reviewed by me with resolution of ggo ? ?Pulmonary Functions Testing Results: ? ?  Latest Ref Rng & Units 01/10/2021  ?  3:29 PM  ?PFT Results  ?FVC-Pre L 1.73    ?FVC-Predicted Pre % 59    ?FVC-Post L 1.87    ?FVC-Predicted Post % 64    ?Pre FEV1/FVC % % 87    ?Post FEV1/FCV % % 90    ?FEV1-Pre L 1.51    ?FEV1-Predicted Pre % 67    ?FEV1-Post L 1.70    ?DLCO uncorrected ml/min/mmHg 19.72    ?DLCO UNC% % 106    ?DLCO corrected ml/min/mmHg 19.72     ?DLCO COR %Predicted % 106    ?DLVA Predicted % 126    ?TLC L 4.42    ?TLC % Predicted % 92    ?RV % Predicted % 129    ? ?PFT 01/10/21 Air trapping and borderline significant bronchodilator response ? ? ?   ?[place]

## 2021-08-10 ENCOUNTER — Ambulatory Visit (INDEPENDENT_AMBULATORY_CARE_PROVIDER_SITE_OTHER): Payer: Medicare Other | Admitting: Student

## 2021-08-10 ENCOUNTER — Encounter: Payer: Self-pay | Admitting: Student

## 2021-08-10 VITALS — BP 128/76 | HR 63 | Temp 98.3°F | Ht 62.0 in | Wt 173.8 lb

## 2021-08-10 DIAGNOSIS — R0683 Snoring: Secondary | ICD-10-CM

## 2021-08-10 DIAGNOSIS — J4540 Moderate persistent asthma, uncomplicated: Secondary | ICD-10-CM

## 2021-08-10 DIAGNOSIS — K7469 Other cirrhosis of liver: Secondary | ICD-10-CM

## 2021-08-10 DIAGNOSIS — R053 Chronic cough: Secondary | ICD-10-CM

## 2021-08-10 MED ORDER — ALBUTEROL SULFATE HFA 108 (90 BASE) MCG/ACT IN AERS: 2.0000 | INHALATION_SPRAY | Freq: Four times a day (QID) | RESPIRATORY_TRACT | 6 refills | Status: DC | PRN

## 2021-08-10 MED ORDER — BREZTRI AEROSPHERE 160-9-4.8 MCG/ACT IN AERO: 2.0000 | INHALATION_SPRAY | Freq: Two times a day (BID) | RESPIRATORY_TRACT | 11 refills | Status: DC

## 2021-08-10 NOTE — Patient Instructions (Addendum)
-   OK to try to decrease to breztri 2 puffs once daily. Rinse mouth/gargle afterward. If doing worse go back to twice daily ?- flutter valve 10 slow but firm puffs twice daily when you have chest congestion or productive cough ?- referral made to ENT for possible vocal cord dysfunction ?- home sleep study you will be called to schedule  ?- see you in 6 months or sooner if need be! ?

## 2021-08-10 NOTE — Addendum Note (Signed)
Addended by: Omar Person on: 08/10/2021 01:43 PM ? ? Modules accepted: Orders ? ?

## 2021-08-10 NOTE — Progress Notes (Signed)
CT chest showed clear lungs. She has cirrhosis of the liver. Coronary vascular calcification to LAD, mild atherosclerosis to thoracic aorta.  ? ?Did she go over these results with Dr. [name] today? Did she know about the cirrhosis, liver function was elevated in April. If she does not have a specialist need referral to GI.

## 2021-08-22 ENCOUNTER — Telehealth: Payer: Self-pay | Admitting: Student

## 2021-08-22 MED ORDER — PREDNISONE 10 MG PO TABS: ORAL_TABLET | ORAL | 0 refills | Status: DC

## 2021-08-22 NOTE — Telephone Encounter (Signed)
Called patient and she states since last night she is more congested and having to use her albuterol more then she normally does. She states no fever noted. She does have a more productive cough with yellow mucus noted.   Has only tried robitussin with no help.   Would like advice on fatty liver.   Icard please advise since Dr Verlee Monte is off

## 2021-08-22 NOTE — Telephone Encounter (Signed)
My chart message sent with prescription for steroid taper, instructions to increase breztri to 2 puffs twice daily and encouraged to discuss fatty liver with PCP, await call for GI referral.

## 2021-08-22 NOTE — Telephone Encounter (Signed)
Called patient and informed her of the updates from Dr [name]. Patent voiced understanding. Nothing further needed

## 2021-08-22 NOTE — Telephone Encounter (Signed)
Pt states she is having to use albuterol more since yesterday. Coughing more. No fever noted. Mucus production that is yellow in color. Pt states that she tried robitussin but it did not help.   She states she is feeling more congested since yesterday.    Pt would like some feedback on her fatty liver.   Icard please advise since Dr [name] is off

## 2021-12-06 NOTE — Progress Notes (Unsigned)
Synopsis: Referred for COPD with exacerbation by Marylen Ponto, MD  Subjective:   PATIENT ID: Kathryn Hicks GENDER: female DOB: 06-Dec-1955, MRN: [id number]  No chief complaint on file.  She has history of COPD, GERD, smoking 8py in distant past with a lot secondhand exposure and some woodstove smoke exposure, hypothyroid and is referred for COPD with exacerbation.   4 months ago with severe cough, especially over last 6 weeks. Has been to urgent care x3, ED x1. Feels better when she gets steroids. It is productive cough. No fever throughout. 3 rounds of ABX (clindamcyin and a couple of others). Did take fluconazole as well. Only feels dyspneic when she is coughing. Only has chest pain when she's been coughing.  Interval HPI: Prednisone taper given, increased to breztri 2 puffs twice daily  Referred to GI for fatty liver.   Otherwise pertinent review of systems is negative.  Past Medical History:  Diagnosis Date   Anxiety    Depression    GERD (gastroesophageal reflux disease)    Hypertension    Hypothyroidism    followed by pcp   Migraines    OA (osteoarthritis)    knees   Pneumonia    hx of    PONV (postoperative nausea and vomiting)    woke up during knee surgery    Wears glasses      Family History  Problem Relation Age of Onset   Prostate cancer Father    Heart attack Father    Wilm's tumor Daughter      Past Surgical History:  Procedure Laterality Date   ABDOMINAL HYSTERECTOMY  2003    w/  Bilateral Salpingo-oophorectomy   CARPAL TUNNEL RELEASE Left 2014   CESAREAN SECTION  x2  last one 1981   COLONOSCOPY     KNEE ARTHROSCOPY Left 06-21-2017   dr Darrelyn Hillock   LAPAROSCOPIC CHOLECYSTECTOMY  2004   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   Procedure: TOTAL KNEE ARTHROPLASTY;  Surgeon: [name], MD;  Location: [facility];  Service: Orthopedics;  Laterality: Left;    TOTAL KNEE ARTHROPLASTY Right 07/13/2020   Procedure: TOTAL KNEE ARTHROPLASTY;   Surgeon: Durene Romans, MD;  Location: [facility];  Service: Orthopedics;  Laterality: Right;  70 mins    Social History   Socioeconomic History   Marital status: Married    Spouse name: Not on file   Number of children: Not on file   Years of education: Not on file   Highest education level: Not on file  Occupational History   Not on file  Tobacco Use   Smoking status: Former    Years: 10.00    Types: Cigarettes    Quit date: 05/22/1977    Years since quitting: 44.5   Smokeless tobacco: Never  Vaping Use   Vaping Use: Never used  Substance and Sexual Activity   Alcohol use: Never    Comment: rare   Drug use: Never   Sexual activity: Not on file  Other Topics Concern   Not on file  Social History Narrative   Not on file   Social Determinants of Health   Financial Resource Strain: Not on file  Food Insecurity: Not on file  Transportation Needs: Not on file  Physical Activity: Not on file  Stress: Not on file  Social Connections: Not on file  Intimate Partner Violence: Not on file     Allergies  Allergen Reactions   Butalbital Hives    Other reaction(s): Other (See Comments)  unknown   Sporanos [Itraconazole] Hives and Rash    Welts full body     Outpatient Medications Prior to Visit  Medication Sig Dispense Refill   albuterol (VENTOLIN HFA) 108 (90 Base) MCG/ACT inhaler Inhale 2 puffs into the lungs every 6 (six) hours as needed for wheezing or shortness of breath. 8 g 6   almotriptan (AXERT) 12.5 MG tablet Take 12.5 mg by mouth as needed for migraine. may repeat in 2 hours if needed     ALPRAZolam (XANAX) 1 MG tablet Take 1 mg by mouth at bedtime.     amLODipine (NORVASC) 2.5 MG tablet Take 2.5 mg by mouth daily.     Biotin 5000 MCG TABS Take 5,000 mcg by mouth daily.      Brimonidine Tartrate (LUMIFY) 0.025 % SOLN Place 1 drop into both eyes daily as needed (dry eyes).     Budeson-Glycopyrrol-Formoterol (BREZTRI AEROSPHERE) 160-9-4.8 MCG/ACT AERO Inhale 2 puffs  into the lungs in the morning and at bedtime. 32.1 g 11   cholecalciferol (VITAMIN D3) 25 MCG (1000 UT) tablet Take 1,000 Units by mouth daily.     diphenhydrAMINE (BENADRYL) 25 MG tablet Take 25 mg by mouth daily as needed for allergies.     esomeprazole (NEXIUM) 20 MG capsule Take 20 mg by mouth daily as needed.      estrogens, conjugated, (PREMARIN) 0.9 MG tablet Take 0.9 mg by mouth daily.      lamoTRIgine (LAMICTAL) 150 MG tablet Take 150 mg by mouth 2 (two) times daily.     levothyroxine (SYNTHROID, LEVOTHROID) 25 MCG tablet Take 25 mcg by mouth daily before breakfast.     Misc Natural Products (OSTEO BI-FLEX TRIPLE STRENGTH PO) Take 1 tablet by mouth daily.      nystatin (MYCOSTATIN) 100000 UNIT/ML suspension TAKE 5 MLS BY MOUTH 4 TIMES DAILY. 120 mL 1   olmesartan-hydrochlorothiazide (BENICAR HCT) 40-25 MG tablet Take 1 tablet by mouth daily.     predniSONE (DELTASONE) 10 MG tablet Take 4 tabs by mouth for 3 days, then 3 for 3 days, 2 for 3 days, 1 for 3 days and stop 30 tablet 0   No facility-administered medications prior to visit.       Objective:   Physical Exam:  General appearance: 66 y.o., female, NAD, conversant  Eyes: anicteric sclerae, moist conjunctivae; no lid-lag; PERRL, tracking appropriately HENT: NCAT; oropharynx, MMM, no thrush Neck: Trachea midline; no lymphadenopathy, no JVD Lungs: CTAB, normal wob CV: RRR, no MRGs  Abdomen: Soft, non-tender; non-distended, BS present  Extremities: No peripheral edema, radial and DP pulses present bilaterally  Skin: Normal temperature, turgor and texture; no rash Psych: Appropriate affect Neuro: Alert and oriented to person and place, no focal deficit    There were no vitals filed for this visit.      on RA BMI Readings from Last 3 Encounters:  08/10/21 31.79 kg/m  05/10/21 31.68 kg/m  02/07/21 32.15 kg/m   Wt Readings from Last 3 Encounters:  08/10/21 173 lb 12.8 oz (78.8 kg)  05/10/21 173 lb 3.2 oz (78.6  kg)  02/07/21 178 lb 9.6 oz (81 kg)     CBC    Component Value Date/Time   WBC 10.4 05/10/2021 1137   RBC 4.45 05/10/2021 1137   HGB 14.0 05/10/2021 1137   HCT 42.0 05/10/2021 1137   PLT 211.0 05/10/2021 1137   MCV 94.4 05/10/2021 1137   MCH 32.7 07/14/2020 0321   MCHC 33.3 05/10/2021 1137   RDW  13.6 05/10/2021 1137   LYMPHSABS 2.3 05/10/2021 1137   MONOABS 0.7 05/10/2021 1137   EOSABS 0.1 05/10/2021 1137   BASOSABS 0.0 05/10/2021 1137      Chest Imaging: CT Chest 12/28/20 with 2 areas of subtle patchy ggo, otherwise unremarkable  CT Chest 08/05/21 reviewed by me with resolution of ggo  Pulmonary Functions Testing Results:    Latest Ref Rng & Units 01/10/2021    3:29 PM  PFT Results  FVC-Pre L 1.73   FVC-Predicted Pre % 59   FVC-Post L 1.87   FVC-Predicted Post % 64   Pre FEV1/FVC % % 87   Post FEV1/FCV % % 90   FEV1-Pre L 1.51   FEV1-Predicted Pre % 67   FEV1-Post L 1.70   DLCO uncorrected ml/min/mmHg 19.72   DLCO UNC% % 106   DLCO corrected ml/min/mmHg 19.72   DLCO COR %Predicted % 106   DLVA Predicted % 126   TLC L 4.42   TLC % Predicted % 92   RV % Predicted % 129    PFT 01/10/21 Air trapping and borderline significant bronchodilator response      Assessment & Plan:   # Chronic cough # Moderate persistent asthma Less likely but possible comorbid VCD given some hoarseness (and possible throat tightness) which accompanies episodes of worse asthma control.   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   # Morning headaches # Snoring # Excessive daytime sleepiness  # Possible compensated cirrhosis: no ascites on exam, history of EV/PSE, and synthetic function appears intact with normal INR/albumin historically  Plan: - ok to try to scale back to breztri 2 puffs once daily - if worse control of cough/DOE then to resume BID dosing  - prn albuterol - flutter valve 10 slow but firm puffs twice daily after bronchodilators if chest  congestion or productive cough - ENT referral for possible VCD - home sleep study - referral made to GI     Omar Person, MD [facility] 12/06/2021 1:40 PM

## 2021-12-07 ENCOUNTER — Other Ambulatory Visit: Payer: Self-pay | Admitting: Primary Care

## 2021-12-07 ENCOUNTER — Encounter: Payer: Self-pay | Admitting: Student

## 2021-12-07 ENCOUNTER — Ambulatory Visit (INDEPENDENT_AMBULATORY_CARE_PROVIDER_SITE_OTHER): Payer: Medicare Other | Admitting: Student

## 2021-12-07 VITALS — BP 112/68 | HR 57 | Ht 62.5 in | Wt 173.0 lb

## 2021-12-07 DIAGNOSIS — J449 Chronic obstructive pulmonary disease, unspecified: Secondary | ICD-10-CM | POA: Diagnosis not present

## 2021-12-07 NOTE — Patient Instructions (Addendum)
-   OK to try to decrease to breztri 2 puffs once to twice daily as needed. Rinse mouth/gargle afterward. If doing worse go back to twice daily - flutter valve 10 slow but firm puffs twice daily when you have chest congestion or productive cough - Consider modified barium swallow with speech if having recurrent aspiration or pneumonias, trouble swallowing - send me a message or call if you'd like to pursue it

## 2021-12-26 ENCOUNTER — Other Ambulatory Visit: Payer: Self-pay | Admitting: Specialist

## 2021-12-26 DIAGNOSIS — K7460 Unspecified cirrhosis of liver: Secondary | ICD-10-CM

## 2022-02-15 ENCOUNTER — Ambulatory Visit
Admission: RE | Admit: 2022-02-15 | Discharge: 2022-02-15 | Disposition: A | Discharge status: Home / Self Care | Payer: Medicare Other | Source: Ambulatory Visit | Attending: Specialist | Admitting: Specialist

## 2022-02-15 DIAGNOSIS — K7460 Unspecified cirrhosis of liver: Secondary | ICD-10-CM

## 2022-05-15 IMAGING — CT CT CHEST W/O CM
2 of 3 series · 15 of 36 positions shown, 18 images · non-contrast
Comparison: December 28, 2020.

CLINICAL DATA: Lung nodule, cough.



[Series 2: thorax · axial · 0.75mm/px · z∈[-10,+214]mm · 12 of 132 slices shown, 15 images]
[im 10/132  mediastinal]
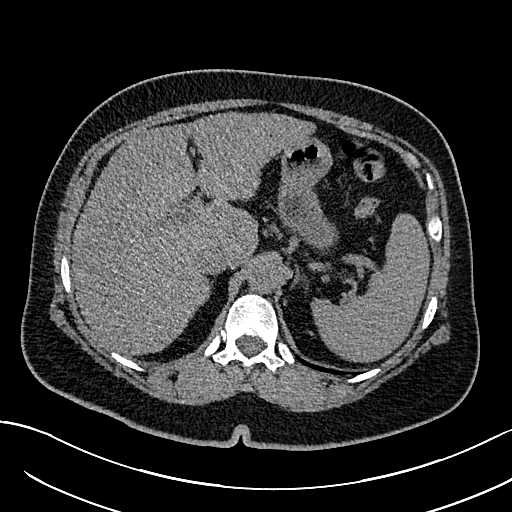
[im 10/132  lung]
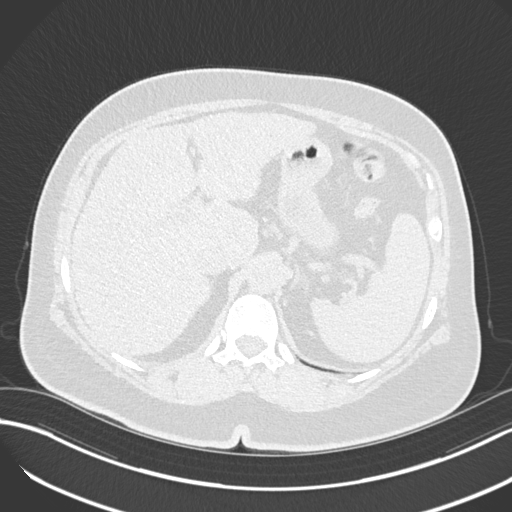
[im 20/132  lung]
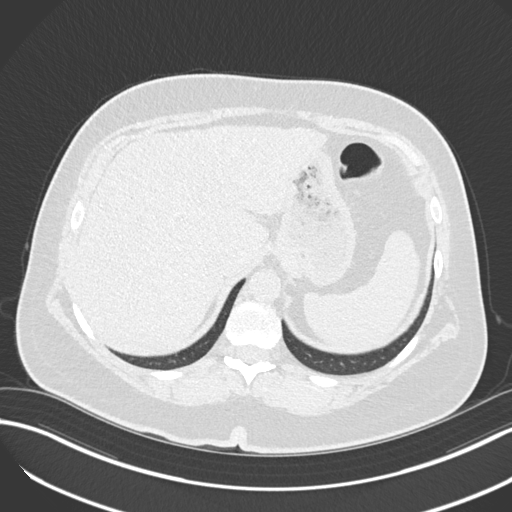
[im 30/132  lung]
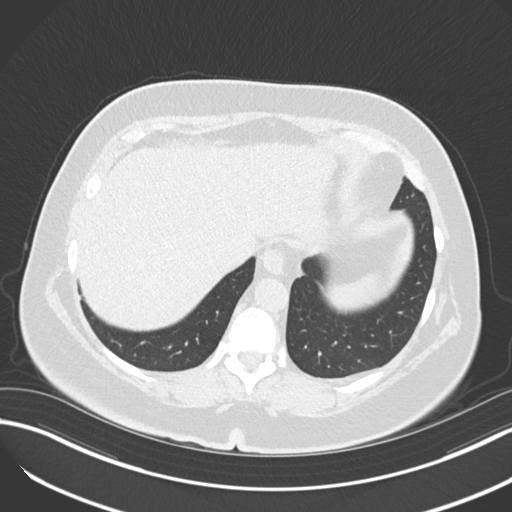
[im 39/132  lung]
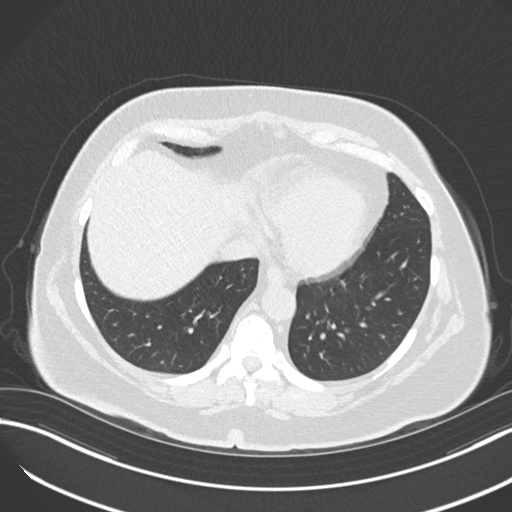
[im 49/132  mediastinal]
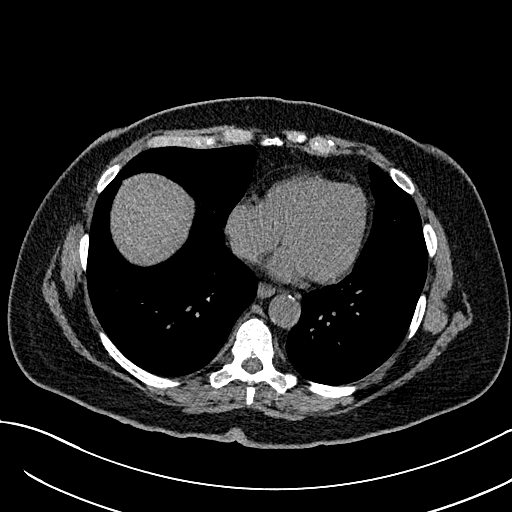
[im 49/132  lung]
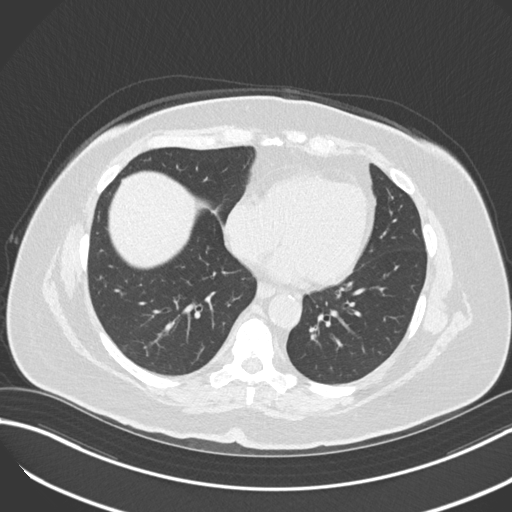
[im 59/132  lung]
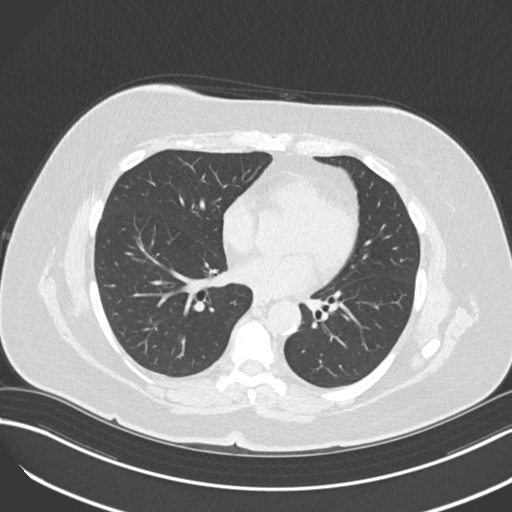
[im 73/132  lung]
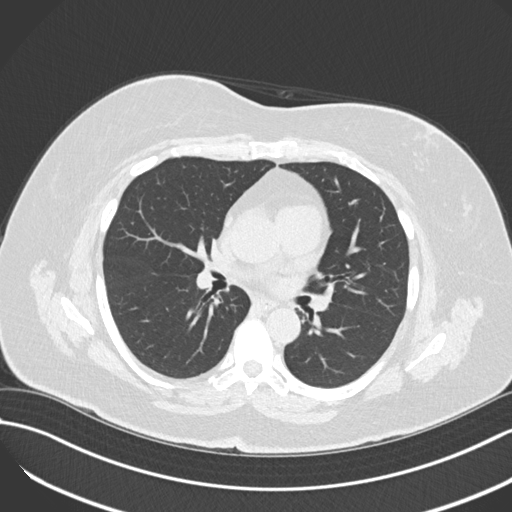
[im 83/132  lung]
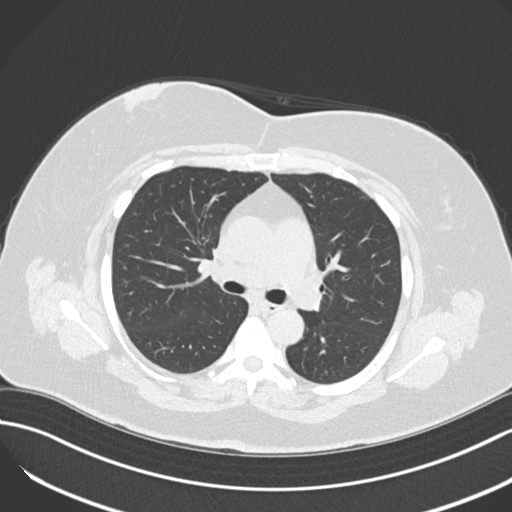
[im 93/132  mediastinal]
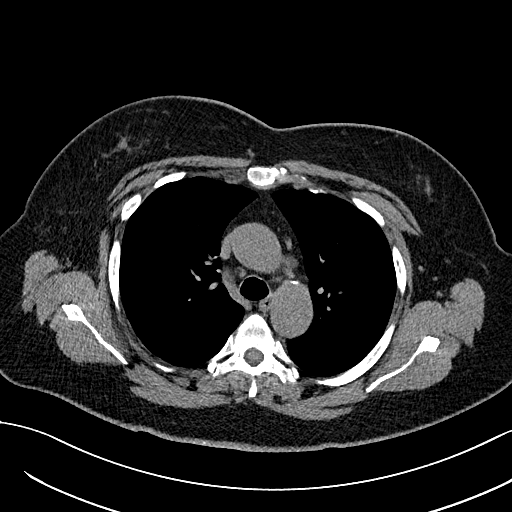
[im 93/132  lung]
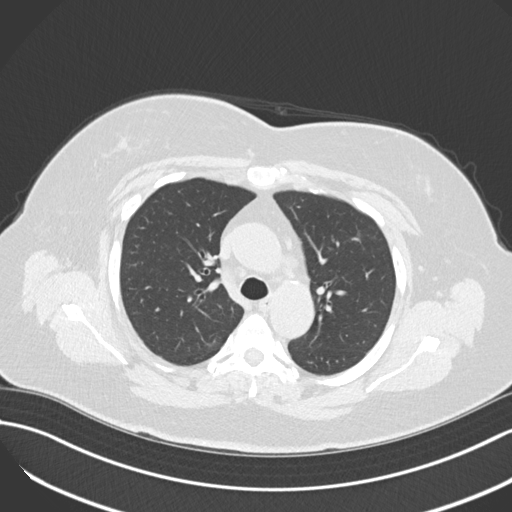
[im 102/132  lung]
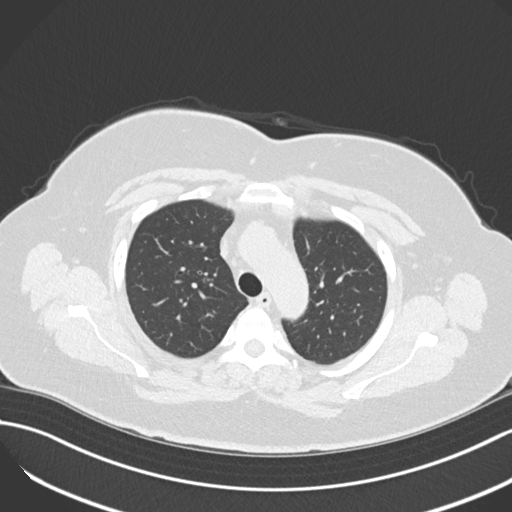
[im 112/132  lung]
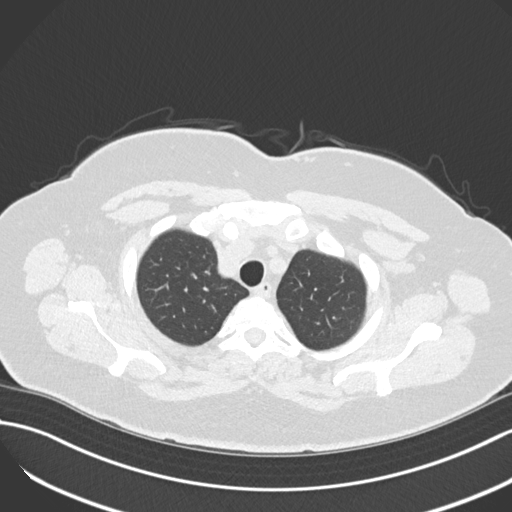
[im 122/132  lung]
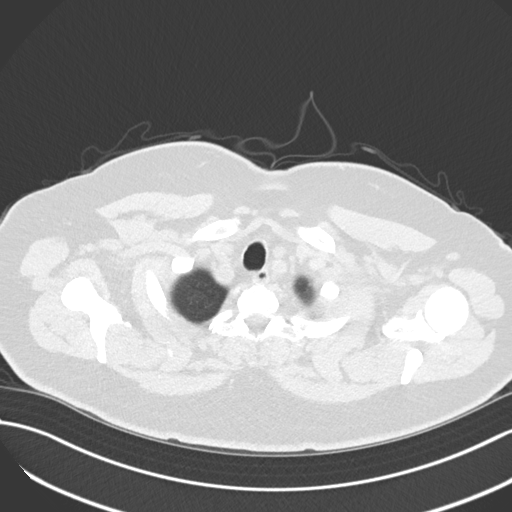

[Series 5: coronal · coronal · 0.59mm/px · 3 of 138 slices shown]
[im 28/138  lung]
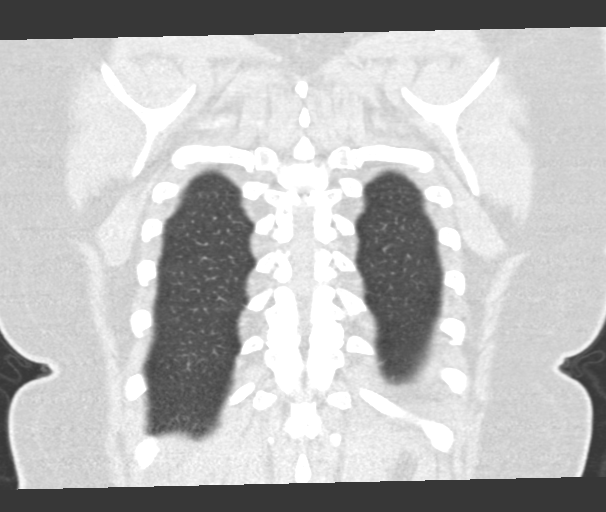
[im 55/138  lung]
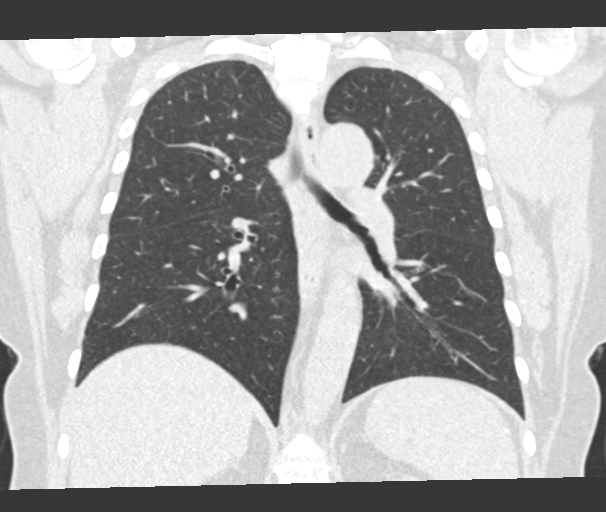
[im 83/138  lung]
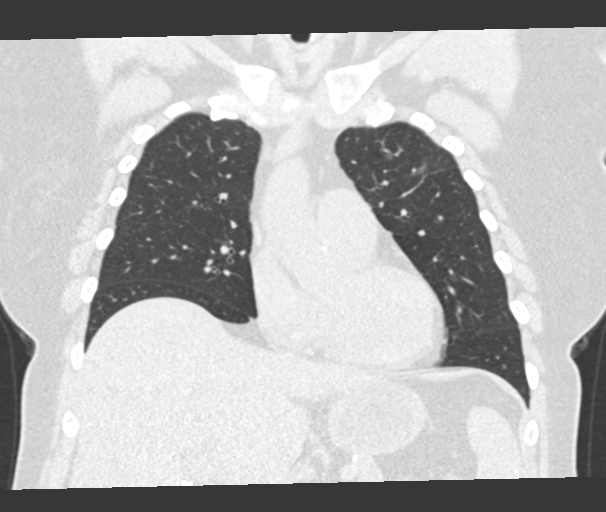

[15 of 36 positions shown; findings below may reference images not displayed]

FINDINGS: Cardiovascular: Atherosclerosis of thoracic aorta is noted without
aneurysm formation. Normal cardiac size. No pericardial effusion.

Mediastinum/Nodes: No enlarged mediastinal or axillary lymph nodes.
Thyroid gland, trachea, and esophagus demonstrate no significant
findings.

Lungs/Pleura: No pneumothorax or pleural effusion is noted.
Ground-glass opacity seen in left upper lobe on prior exam is not
visualized currently and presumably represented focal inflammation.
There is now noted new ground-glass opacity measuring 9 mm
posteriorly in right upper lobe along major fissure best seen on
image number 42 of series 4.

Upper Abdomen: Nodular hepatic contours are noted again noted
suggesting hepatic cirrhosis.

Musculoskeletal: No chest wall mass or suspicious bone lesions
identified.
IMPRESSION: Ground-glass opacity seen in left upper lobe on prior exam is not
visualized currently presumably represented focal inflammation.
There is now noted new ground-glass opacity measuring 9 mm
posteriorly in right upper lobe which may represent focal
inflammation, but neoplasm cannot be excluded. Initial follow-up by
chest CT without contrast is recommended in 3 months to confirm
persistence. This recommendation follows the consensus statement:
Recommendations for the Management of Subsolid Pulmonary Nodules
Detected at CT: A Statement from the [HOSPITAL] as published

Findings consistent with hepatic cirrhosis.

Aortic Atherosclerosis (S1J50-YRN.N).

## 2022-08-21 ENCOUNTER — Telehealth: Payer: Self-pay | Admitting: Student

## 2022-08-21 MED ORDER — AZITHROMYCIN 250 MG PO TABS: ORAL_TABLET | ORAL | 0 refills | Status: DC

## 2022-08-21 MED ORDER — PREDNISONE 10 MG PO TABS: ORAL_TABLET | ORAL | 0 refills | Status: DC

## 2022-08-21 NOTE — Telephone Encounter (Signed)
Patient states having symptoms of shortness of breath and cough. Patient declined appointment. Pharmacy is [facility]. Patient phone number is [phone].

## 2022-08-21 NOTE — Telephone Encounter (Signed)
Spoke with patient. She complains of productive cough with yellow phlegm, wheezing and SOB. Symptoms present x 2 weeks or more-patient thought it was allergies, but symptoms have been getting worse over the last 3-4 days. Patient has used inhalers and flutter valve with no relief.  Dr. [name] please advise?    Pharmacy [facility] in [place]

## 2022-08-21 NOTE — Telephone Encounter (Signed)
Prednisone taper and z pack sent to her preferred pharmacy

## 2022-08-25 ENCOUNTER — Other Ambulatory Visit: Payer: Self-pay | Admitting: Nurse Practitioner

## 2022-08-25 DIAGNOSIS — K7460 Unspecified cirrhosis of liver: Secondary | ICD-10-CM

## 2022-09-04 ENCOUNTER — Ambulatory Visit
Admission: RE | Admit: 2022-09-04 | Discharge: 2022-09-04 | Disposition: A | Discharge status: Home / Self Care | Payer: Medicare Other | Source: Ambulatory Visit | Attending: Nurse Practitioner | Admitting: Nurse Practitioner

## 2022-09-04 DIAGNOSIS — K7460 Unspecified cirrhosis of liver: Secondary | ICD-10-CM

## 2022-09-04 IMAGING — CT CT CHEST W/O CM
2 of 3 series · 15 of 36 positions shown, 18 images · non-contrast
Comparison: Chest CT dated 04/15/2021 and radiograph dated
01/19/2021.

CLINICAL DATA: Abnormal x-ray.



[Series 2: thorax · axial · 0.70mm/px · z∈[-257,-41]mm · 12 of 128 slices shown, 15 images]
[im 10/128  mediastinal]
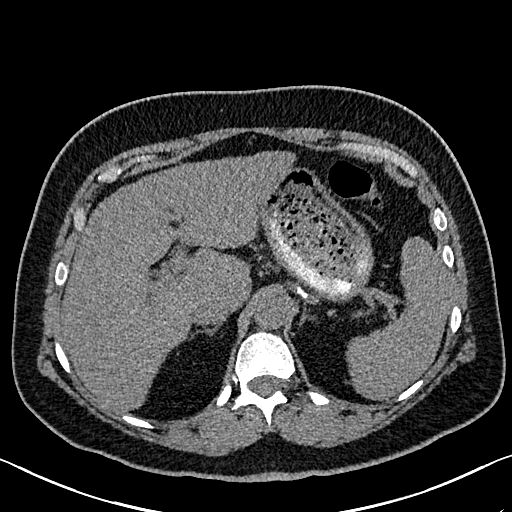
[im 10/128  lung]
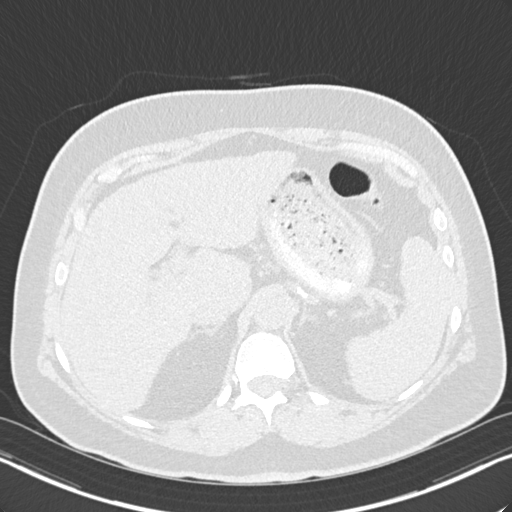
[im 19/128  lung]
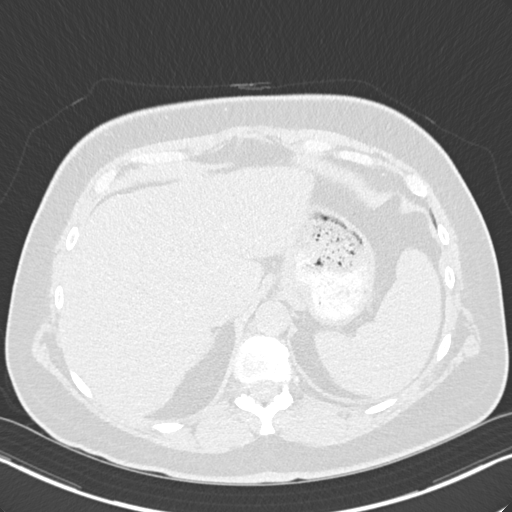
[im 29/128  lung]
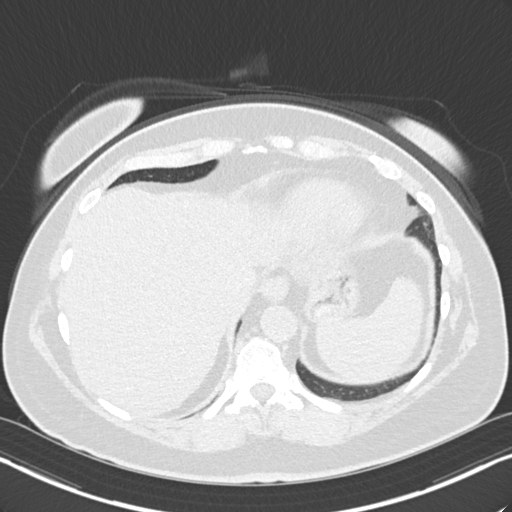
[im 38/128  lung]
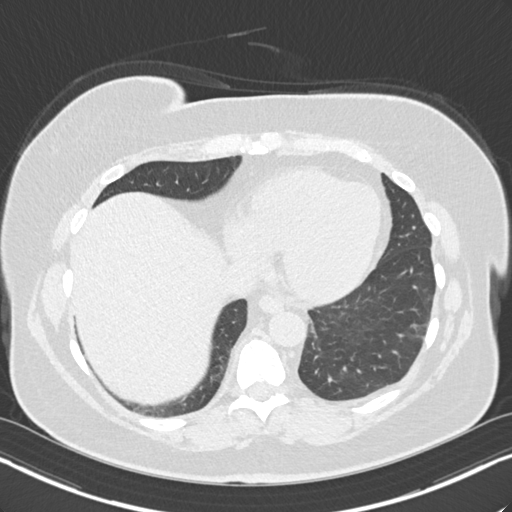
[im 48/128  mediastinal]
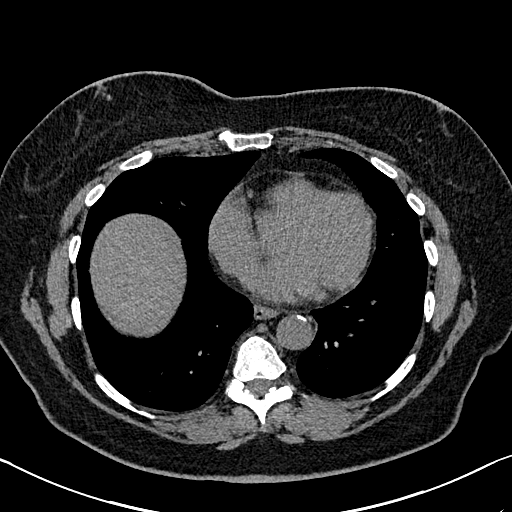
[im 48/128  lung]
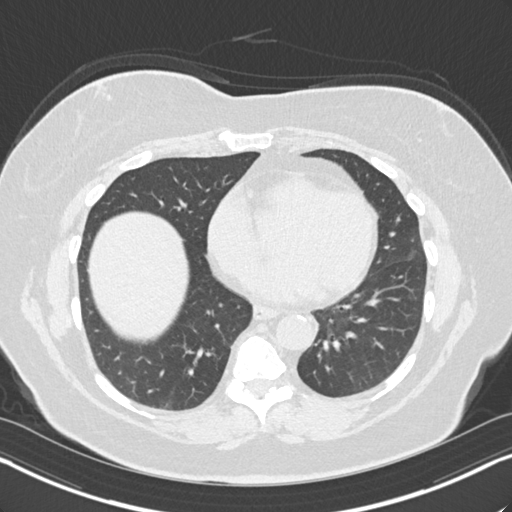
[im 57/128  lung]
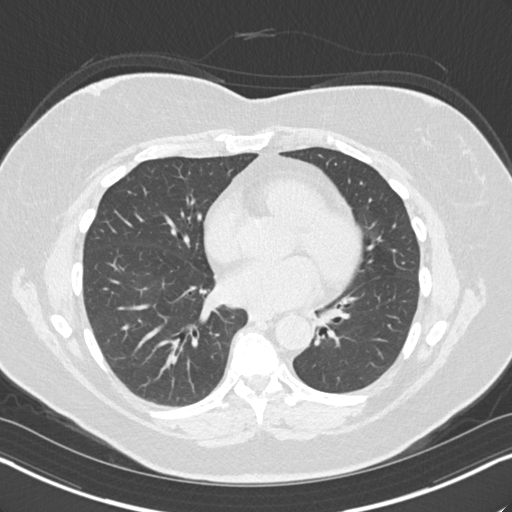
[im 71/128  lung]
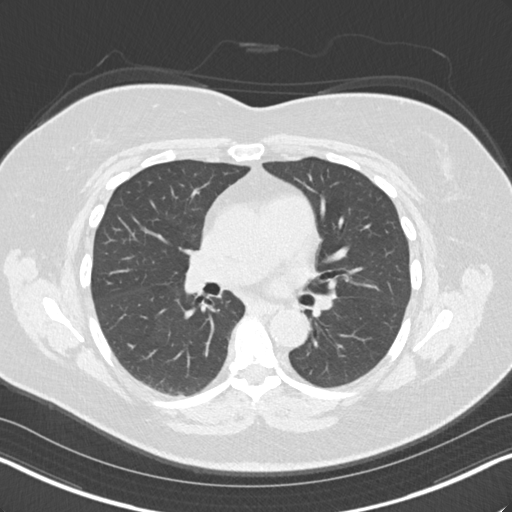
[im 80/128  lung]
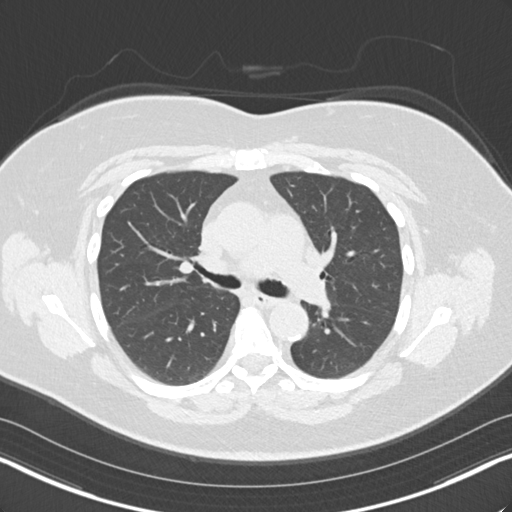
[im 90/128  mediastinal]
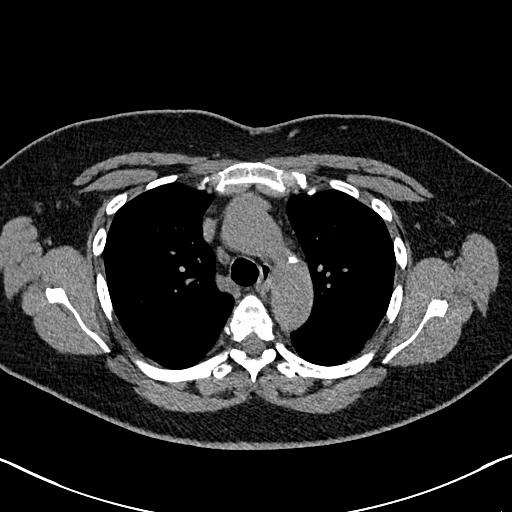
[im 90/128  lung]
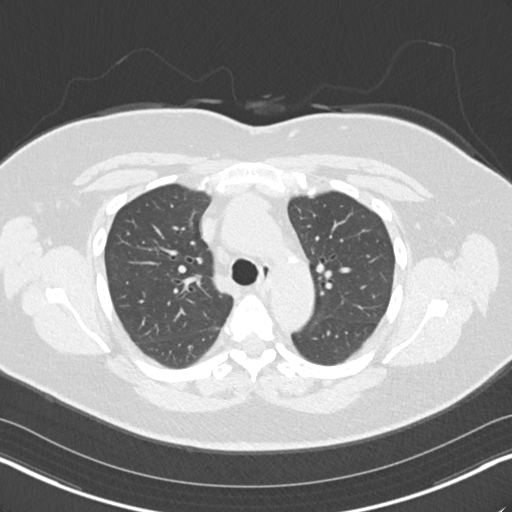
[im 99/128  lung]
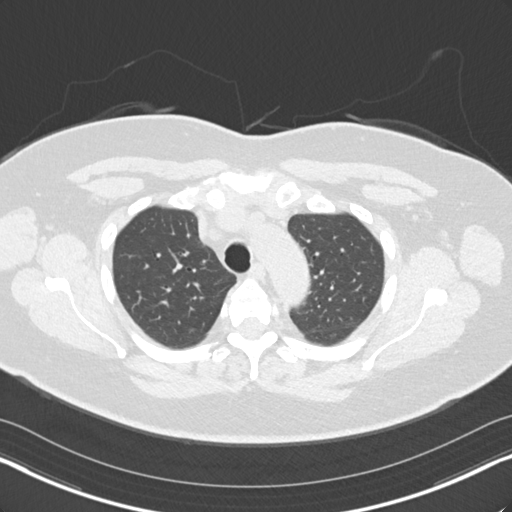
[im 109/128  lung]
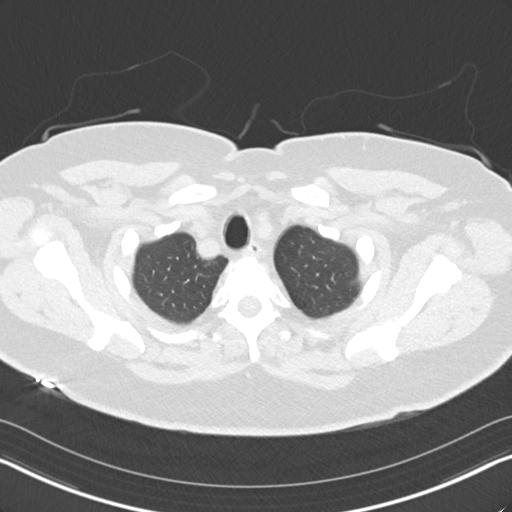
[im 118/128  lung]
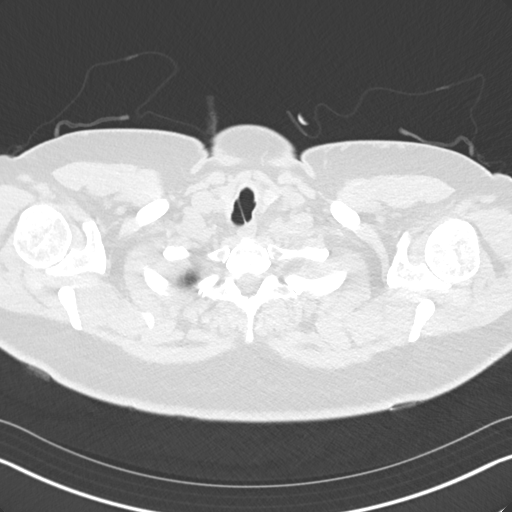

[Series 5: coronal · coronal · 0.53mm/px · 3 of 133 slices shown]
[im 27/133  lung]
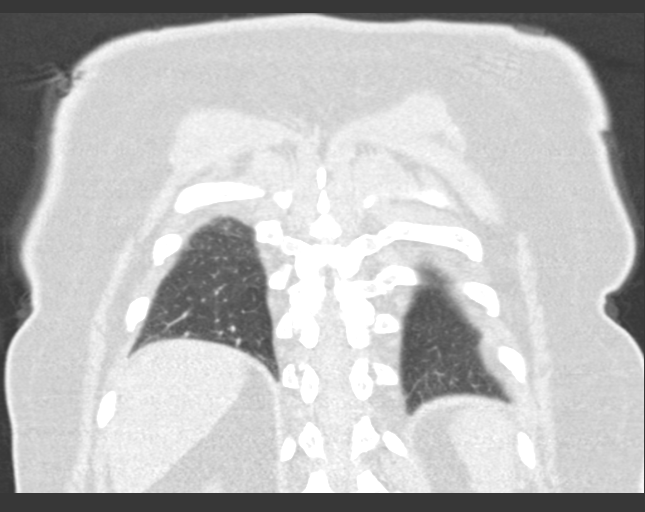
[im 53/133  lung]
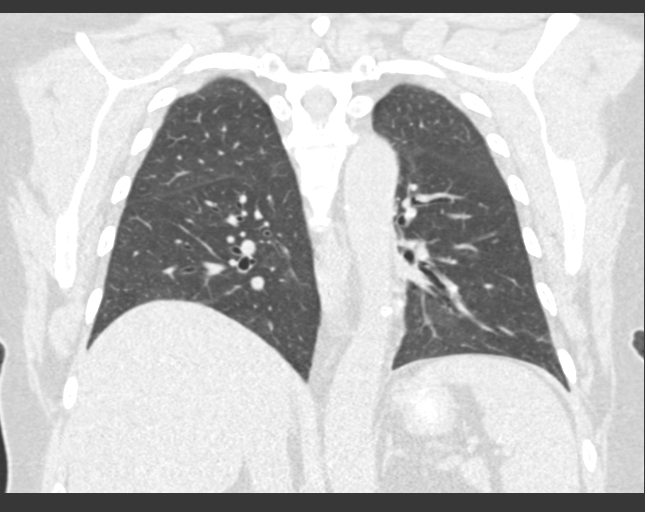
[im 80/133  lung]
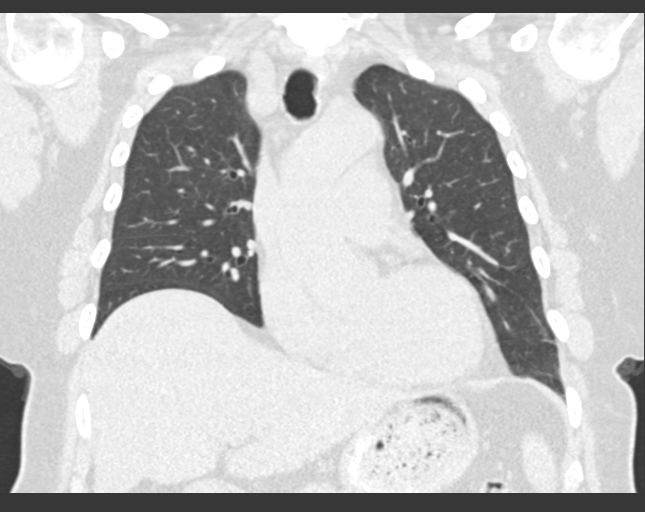

[15 of 36 positions shown; findings below may reference images not displayed]

FINDINGS: Evaluation of this exam is limited in the absence of intravenous
contrast.

Cardiovascular: There is no cardiomegaly or pericardial effusion.
There is coronary vascular calcification of the LAD. Mild
atherosclerotic calcification of the thoracic aorta. No aneurysmal
dilatation. The central pulmonary arteries are grossly unremarkable
on this noncontrast CT.

Mediastinum/Nodes: No hilar or mediastinal adenopathy. The esophagus
is grossly unremarkable. No mediastinal fluid collection.

Lungs/Pleura: The lungs are clear. There is no pleural effusion or
pneumothorax. The central airways are patent.

Upper Abdomen: Cirrhosis.  Cholecystectomy.

Musculoskeletal: No acute osseous pathology.
IMPRESSION: 1. No acute intrathoracic pathology.
2. Cirrhosis.
3. Aortic Atherosclerosis ([id number]-S5X.X).

## 2022-09-07 ENCOUNTER — Other Ambulatory Visit: Payer: Self-pay | Admitting: Nurse Practitioner

## 2022-09-07 DIAGNOSIS — D376 Neoplasm of uncertain behavior of liver, gallbladder and bile ducts: Secondary | ICD-10-CM

## 2022-09-28 ENCOUNTER — Other Ambulatory Visit: Payer: Self-pay | Admitting: Student

## 2022-09-29 ENCOUNTER — Telehealth: Payer: Self-pay | Admitting: Student

## 2022-09-29 MED ORDER — DOXYCYCLINE HYCLATE 100 MG PO TABS
100.0000 mg | ORAL_TABLET | Freq: Two times a day (BID) | ORAL | 0 refills | Status: DC
Start: 2022-09-29 — End: 2022-11-27

## 2022-09-29 MED ORDER — PREDNISONE 10 MG PO TABS: ORAL_TABLET | ORAL | 0 refills | Status: DC

## 2022-09-29 NOTE — Telephone Encounter (Signed)
Spoke with patient she was last treated for acute exacerbation in May, symptoms temporarily improved on azithromycin prednisone.  Cough and shortness of breath recently worsened 2 weeks ago.  He is using Breztri as prescribed twice daily and albuterol nebulizer 3 times daily.  Sent in Rx for doxycyline and prednisone taper, please call patient and get them a regular follow-up in the next 4-8 weeks with either myself or a new pulmonary provider (previous Dr. [name] patient).

## 2022-09-29 NOTE — Telephone Encounter (Signed)
Called and spoke with patient. Patient stated she has been seen by Dr. [name] and Jasper Loser in the past. Patient stated she is having a COPD exacerbation with increased sob with productive cough. Patient stated in the past she had been prescribed prednisone and a antibiotic. Patient stated she is taking Breztri as prescribed twice daily and albuterol inhaler X's 3 times daily.  Patient stated she would prescriptions sent to [facility] [address] [facility].   Message routed to Beth,NP to advise

## 2022-09-29 NOTE — Telephone Encounter (Signed)
Former pt of Dr. [name] Pt requesting to see Ames Dura as her new provider  Pt calling in b/c she stating she is having a flare up of her COPD, Pt sounds very short winded and very worried. Pt states Dr. [name] prescribed prednisone in the past. Pt states she takes her breathing treatment as well.

## 2022-10-02 NOTE — Telephone Encounter (Signed)
Spoke with patient regarding prior message Kathryn Hicks sent in medication   Sent in Rx for doxycyline and prednisone taper, please call patient and get them a regular follow-up in the next 4-8 weeks with either myself or a new pulmonary provider (previous Dr. [name] patient).   Patient was scheduled for a f/u on 11/27/22.  Patient 's voice was understanding.Nothing else further needed.

## 2022-10-19 ENCOUNTER — Other Ambulatory Visit: Payer: Medicare Other

## 2022-10-24 ENCOUNTER — Other Ambulatory Visit: Payer: Medicare Other

## 2022-11-08 ENCOUNTER — Other Ambulatory Visit: Payer: Medicare Other

## 2022-11-08 DIAGNOSIS — D376 Neoplasm of uncertain behavior of liver, gallbladder and bile ducts: Secondary | ICD-10-CM

## 2022-11-08 MED ORDER — GADOPICLENOL 0.5 MMOL/ML IV SOLN
7.5000 mL | Freq: Once | INTRAVENOUS | Status: AC | PRN
  Administered 2022-11-08: 7.5 mL via INTRAVENOUS

## 2022-11-27 ENCOUNTER — Ambulatory Visit: Payer: Medicare Other | Admitting: Primary Care

## 2022-11-27 ENCOUNTER — Telehealth: Payer: Self-pay | Admitting: Pulmonary Disease

## 2022-11-27 ENCOUNTER — Encounter: Payer: Self-pay | Admitting: Primary Care

## 2022-11-27 VITALS — BP 118/74 | HR 67 | Temp 97.7°F | Ht 62.5 in | Wt 177.8 lb

## 2022-11-27 DIAGNOSIS — J4489 Other specified chronic obstructive pulmonary disease: Secondary | ICD-10-CM

## 2022-11-27 NOTE — Patient Instructions (Addendum)
Recommendation: - Continue Breztri 2 puffs every morning and evening - Use albuterol rescue inhaler 2 puffs every 4-6 hours as needed for breakthrough shortness of breath or wheezing - Ask GI doctor if you can restart montelukast  Orders: - New nebulizer machine / Albuterol solution re: asthma   Follow-up: - 3 months with Dr. Celine Mans (new-patient/ former Dr. Charleen Kirks- 30 mins)

## 2022-11-27 NOTE — Telephone Encounter (Signed)
Kathryn Hicks; Kathryn Hicks, [place]; [place], [place]; Kathe Becton I wanted to make sure you knew, for Medicare patients getting nebulizers, we have to also have the rx specify the accessories, the nebulizer kit and filter. I was just made aware of this Friday!  Thanks!

## 2022-11-27 NOTE — Progress Notes (Signed)
@Patient  ID: Kathryn Hicks, female    DOB: [birth year], 67 y.o.   MRN: [id number]  Chief Complaint  Patient presents with   Follow-up    Year follow up.  Cough with chest congestion since February 2024.    Referring provider: Marylen Ponto, MD  HPI: 67 year old female, former smoker. She has history of COPD, GERD, smoking 8py in distant past with a lot secondhand exposure and some woodstove smoke exposure, hypothyroid and is referred for COPD with exacerbation.   Previous [facility] pulmonary encounter: 12/07/21- Dr. [name] Prednisone taper given, increased to breztri 2 puffs twice daily  Referred to GI for fatty liver - saw GI.   She's actually stopped all inhalers since last visit - only uses albuterol rarely.   Home apnea-link HST, AHI = 0.5, no apnea  Otherwise pertinent review of systems is negative.  # Chronic cough # Moderate persistent asthma Less likely but possible comorbid VCD given some hoarseness (and possible throat tightness) which accompanies episodes of worse asthma control.    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 She was not able to taper Breztri dosing. She had a flare of her asthma symptoms in February and May requiring abx and prednisone. She feels her asthma symptoms are mostly controlled when taking Breztri twice a day. Singing will cause her to cough. She coughs up light colored sputum 1-2 times a day. Using albuterol more with heat/humidity and exertion.   Allergies  Allergen Reactions   Butalbital Hives    Other reaction(s): Other (See Comments) unknown   Sporanos [Itraconazole] Hives and Rash    Welts full body    Immunization History   Administered Date(s) Administered   Fluad Quad(high Dose 65+) 02/07/2021   Influenza,inj,Quad PF,6+ Mos 02/05/2018   Moderna Sars-Covid-2 Vaccination 06/12/2019, 07/17/2019, 01/26/2020   PFIZER(Purple Top)SARS-COV-2 Vaccination 03/02/2021   Pneumococcal Polysaccharide-23 02/05/2018    Past Medical History:  Diagnosis Date   Anxiety    Depression    GERD (gastroesophageal reflux disease)    Hypertension    Hypothyroidism    followed by pcp   Migraines    OA (osteoarthritis)    knees   Pneumonia    hx of    PONV (postoperative nausea and vomiting)    woke up during knee surgery    Wears glasses     Tobacco History: Social History   Tobacco Use  Smoking Status Former   Current packs/day: 0.00   Types: Cigarettes   Start date: 05/23/1967   Quit date: 05/22/1977   Years since quitting: 45.5   Passive exposure: Past  Smokeless Tobacco Never   Counseling given: Not Answered   Outpatient Medications Prior to Visit  Medication Sig Dispense Refill   albuterol (VENTOLIN HFA) 108 (90 Base) MCG/ACT inhaler Inhale 2 puffs into the lungs every 6 (six) hours as needed for wheezing or shortness of breath. 8 g 6   ALPRAZolam (XANAX) 1 MG tablet Take 1 mg by mouth at bedtime.     atenolol (TENORMIN) 25 MG tablet Take 25 mg by mouth at bedtime.     Biotin 5000 MCG TABS Take 5,000 mcg by mouth daily.      BREZTRI AEROSPHERE 160-9-4.8 MCG/ACT AERO  USE 2 INHALATIONS IN THE MORNING AND AT BEDTIME 32.1 g 3   Brimonidine Tartrate (LUMIFY) 0.025 % SOLN Place 1 drop into both eyes daily as needed (dry eyes).     cholecalciferol (VITAMIN D3) 25 MCG (1000 UT) tablet Take 1,000 Units by mouth daily.     diphenhydrAMINE (BENADRYL) 25 MG tablet Take 25 mg by mouth daily as needed for allergies.     esomeprazole (NEXIUM) 20 MG capsule Take 20 mg by mouth daily as needed.      estrogens, conjugated, (PREMARIN) 0.625 MG tablet Take 0.625 mg by mouth daily. Take daily for 21 days then do not take for  7 days.     furosemide (LASIX) 20 MG tablet Take 20 mg by mouth daily as needed for fluid or edema.     Lemborexant (DAYVIGO) 10 MG TABS Take 1 tablet by mouth at bedtime as needed (for sleep).     levothyroxine (SYNTHROID, LEVOTHROID) 25 MCG tablet Take 25 mcg by mouth daily before breakfast.     olmesartan-hydrochlorothiazide (BENICAR HCT) 40-25 MG tablet Take 1 tablet by mouth daily.     almotriptan (AXERT) 12.5 MG tablet Take 12.5 mg by mouth as needed for migraine. may repeat in 2 hours if needed (Patient not taking: Reported on 11/27/2022)     azithromycin (ZITHROMAX) 250 MG tablet Two tablets today, then 1 tablet daily till gone (Patient not taking: Reported on 11/27/2022) 6 each 0   doxycycline (VIBRA-TABS) 100 MG tablet Take 1 tablet (100 mg total) by mouth 2 (two) times daily. (Patient not taking: Reported on 11/27/2022) 14 tablet 0   estrogens, conjugated, (PREMARIN) 0.9 MG tablet Take 0.9 mg by mouth daily.  (Patient not taking: Reported on 11/27/2022)     Misc Natural Products (OSTEO BI-FLEX TRIPLE STRENGTH PO) Take 1 tablet by mouth daily.  (Patient not taking: Reported on 11/27/2022)     nystatin (MYCOSTATIN) 100000 UNIT/ML suspension TAKE 5 MLS BY MOUTH 4 TIMES DAILY. (Patient not taking: Reported on 11/27/2022) 120 mL 1   predniSONE (DELTASONE) 10 MG tablet 4 tabs for 3 days, then 3 tabs for 3 days, 2 tabs for 3 days, then 1 tab for 3 days, then stop (Patient not taking: Reported on 11/27/2022) 30 tablet 0   No facility-administered medications prior to visit.   Review of Systems  Review of Systems  Constitutional: Negative.   HENT: Negative.    Respiratory:  Positive for cough. Negative for shortness of breath and wheezing.    Physical Exam  BP 118/74 (BP Location: Left Arm, Patient Position: Sitting, Cuff Size: Large)   Pulse 67   Temp 97.7 F (36.5 C) (Oral)   Ht 5' 2.5" (1.588 m)   Wt 177 lb 12.8 oz (80.6 kg)   SpO2 96%   BMI 32.00 kg/m  Physical Exam Constitutional:       Appearance: Normal appearance.  HENT:     Head: Normocephalic and atraumatic.  Cardiovascular:     Rate and Rhythm: Regular rhythm.  Pulmonary:     Effort: Pulmonary effort is normal.     Breath sounds: Normal breath sounds.     Comments: CTA Skin:    General: Skin is warm and dry.  Neurological:     General: No focal deficit present.     Mental Status: She is alert and oriented to person, place, and time. Mental status is at baseline.  Psychiatric:        Mood and Affect: Mood normal.  Behavior: Behavior normal.        Thought Content: Thought content normal.      Lab Results:  CBC    Component Value Date/Time   WBC 10.4 05/10/2021 1137   RBC 4.45 05/10/2021 1137   HGB 14.0 05/10/2021 1137   HCT 42.0 05/10/2021 1137   PLT 211.0 05/10/2021 1137   MCV 94.4 05/10/2021 1137   MCH 32.7 07/14/2020 0321   MCHC 33.3 05/10/2021 1137   RDW 13.6 05/10/2021 1137   LYMPHSABS 2.3 05/10/2021 1137   MONOABS 0.7 05/10/2021 1137   EOSABS 0.1 05/10/2021 1137   BASOSABS 0.0 05/10/2021 1137    BMET    Component Value Date/Time   NA 135 07/14/2020 0321   K 4.0 07/14/2020 0321   CL 100 07/14/2020 0321   CO2 26 07/14/2020 0321   GLUCOSE 145 (H) 07/14/2020 0321   BUN 15 07/14/2020 0321   CREATININE 0.81 07/14/2020 0321   CALCIUM 9.0 07/14/2020 0321   GFRNONAA >60 07/14/2020 0321   GFRAA >60 06/13/2018 0530    BNP No results found for: "BNP"  ProBNP No results found for: "PROBNP"  Imaging: MR ABDOMEN WWO CONTRAST  Result Date: 11/10/2022 CLINICAL DATA:  Cirrhosis, characterize liver lesions incidentally identified by ultrasound EXAM: MRI ABDOMEN WITHOUT AND WITH CONTRAST TECHNIQUE: Multiplanar multisequence MR imaging of the abdomen was performed both before and after the administration of intravenous contrast. CONTRAST:  7.5 mL Vueway gadolinium contrast IV COMPARISON:  Abdominal ultrasound, 09/04/2022 FINDINGS: Lower chest: No acute abnormality.  Small hiatal  hernia. Hepatobiliary: Coarse, nodular cirrhotic morphology liver. Hepatic steatosis. Heterogeneous, reticular and nodular enhancement throughout the liver without arterially hyperenhancing lesion. Status post cholecystectomy. No biliary dilatation. Pancreas: Unremarkable. No pancreatic ductal dilatation or surrounding inflammatory changes. Spleen: Normal in size without significant abnormality. Adrenals/Urinary Tract: Adrenal glands are unremarkable. Kidneys are normal, without renal calculi, solid lesion, or hydronephrosis. Stomach/Bowel: Stomach is within normal limits. No evidence of bowel wall thickening, distention, or inflammatory changes. Vascular/Lymphatic: No significant vascular findings are present. No enlarged abdominal lymph nodes. Other: No abdominal wall hernia or abnormality. No ascites. Musculoskeletal: No acute or significant osseous findings. IMPRESSION: 1. Cirrhosis and hepatic steatosis. 2. Heterogeneous, reticular and nodular enhancement throughout the liver without suspicious arterially hyperenhancing lesions to correspond to ultrasound findings. 3. Status post cholecystectomy. 4. Small hiatal hernia. Electronically Signed   By: Jearld Lesch M.D.   On: 11/10/2022 21:58     Assessment & Plan:   Asthma - Currently stable; Asthma flared in February and May 2024 requiring abx and prednisone. Unable to taper Markus Daft, feels symptoms are better controlled when using twice daily. Using Albuterol more in the heat/humidity. She is concerned about re-starting montelukast due to cirrhosis, she will speak with GI. RX nebulizer machine and Albuterol solution to use as needed. Recommen taking mucinex 600mg  twice daily to loosen congestion as needed. Needs to establish with Dr. Celine Mans in 3 months (former Dr. [name] patient)     Glenford Bayley, NP 12/02/2022

## 2022-11-28 ENCOUNTER — Other Ambulatory Visit: Payer: Medicare Other

## 2022-11-29 ENCOUNTER — Telehealth: Payer: Self-pay | Admitting: Primary Care

## 2022-11-29 NOTE — Telephone Encounter (Signed)
Please be sure Sigulair RX gets sent in for 3 mo. She uses [facility].

## 2022-12-02 NOTE — Assessment & Plan Note (Addendum)
-   Currently stable; Asthma flared in February and May 2024 requiring abx and prednisone. Unable to taper Markus Daft, feels symptoms are better controlled when using twice daily. Using Albuterol more in the heat/humidity. She is concerned about re-starting montelukast due to cirrhosis, she will speak with GI. RX nebulizer machine and Albuterol solution to use as needed. Recommend taking mucinex 600mg  twice daily to loosen congestion as needed. Needs to establish with Dr. Celine Mans in 3 months (former Dr. [name] patient)

## 2022-12-05 MED ORDER — MONTELUKAST SODIUM 10 MG PO TABS: 10.0000 mg | ORAL_TABLET | Freq: Every day | ORAL | 0 refills | Status: DC

## 2022-12-08 NOTE — Addendum Note (Signed)
Addended byClyda Greener M on: 12/08/2022 04:57 PM   Modules accepted: Orders

## 2022-12-26 ENCOUNTER — Telehealth: Payer: Self-pay | Admitting: Primary Care

## 2022-12-26 MED ORDER — PREDNISONE 10 MG PO TABS: ORAL_TABLET | ORAL | 0 refills | Status: DC

## 2022-12-26 NOTE — Telephone Encounter (Signed)
Patient states having symptom of shortness of breath. Pharmacy is [facility] [place]. Patient phone number is [phone].

## 2022-12-26 NOTE — Telephone Encounter (Signed)
Make sure she is using Breztri Aerosphere two puffs twice a day. Take daily antihistamine like zyrtec or Claritin along with mucinex 600mg  twice daily. I will send in prednisone taper. Make sure to take a home covid test.

## 2022-12-26 NOTE — Telephone Encounter (Signed)
Spoke with patient and she reports increasing symptoms over the past week: cough (w/spasms), yellow mucus, chest congestion, chills; denies fever or other symptoms. She does sound hoarse on the phone. She has not taken any OTC meds for symptoms. Has been using alb nebs with some improvement. Would like to know what you recommend.   Please advise, thanks!

## 2022-12-27 NOTE — Telephone Encounter (Signed)
Spoke with patient and advised. She will start OTC meds today. She has taken at-home COVID test and it was negative. Advised to contact office if not improving as will need OV to assess. Nothing further needed at this time.

## 2023-01-03 NOTE — Telephone Encounter (Signed)
This encounter was created in error - please disregard.

## 2023-01-09 ENCOUNTER — Telehealth: Payer: Self-pay | Admitting: Primary Care

## 2023-01-09 MED ORDER — AMOXICILLIN-POT CLAVULANATE 875-125 MG PO TABS: 1.0000 | ORAL_TABLET | Freq: Two times a day (BID) | ORAL | 0 refills | Status: DC

## 2023-01-09 MED ORDER — PREDNISONE 10 MG PO TABS: ORAL_TABLET | ORAL | 0 refills | Status: DC

## 2023-01-09 NOTE — Telephone Encounter (Signed)
Patient is aware of below message/recommendations and voiced her understanding.  °Nothing further needed.  ° °

## 2023-01-09 NOTE — Telephone Encounter (Signed)
Patient is returning phone call. Patient phone number is [phone].

## 2023-01-09 NOTE — Telephone Encounter (Signed)
Called and spoke with the pt  She is c/o increased SOB, wheezing, "crackling", cough with dark yellow sputum over the past couple of wks  Denies any fevers, aches She states that a few wks ago she had pred taper and this helped but not for long  She is using her bretri as directed, singulair, allegra, albuterol inhaler and nebs  She is scheduled for appt with Beth for 01/15/23 (soonest opening) but is asking for advice in the meantime Please advise, thank you!  Allergies  Allergen Reactions   Butalbital Hives    Other reaction(s): Other (See Comments) unknown   Sporanos [Itraconazole] Hives and Rash    Welts full body

## 2023-01-09 NOTE — Telephone Encounter (Signed)
Lm x1 for patient.  

## 2023-01-09 NOTE — Telephone Encounter (Signed)
See last signed encounter. I read [name] instructions to her about meds/inhalers. She is still not well and sounds very out of breath. I made appt for Monday but she would like to speak with a nurse. She is experiencing spasms, coughing, productive. Please call to advise what more she can do until Monday.    [phone]

## 2023-01-09 NOTE — Telephone Encounter (Signed)
I will send in abx and extend prednisone. If not taking mucinex or robitussin recommend starting. Continue Breztri, Singulair, Allegra and albuterol nebulizer every 6 hours

## 2023-01-15 ENCOUNTER — Ambulatory Visit (INDEPENDENT_AMBULATORY_CARE_PROVIDER_SITE_OTHER): Payer: Medicare Other | Admitting: Adult Health

## 2023-01-15 ENCOUNTER — Encounter: Payer: Self-pay | Admitting: Adult Health

## 2023-01-15 ENCOUNTER — Ambulatory Visit (INDEPENDENT_AMBULATORY_CARE_PROVIDER_SITE_OTHER): Payer: Medicare Other

## 2023-01-15 VITALS — BP 116/68 | HR 71 | Temp 98.1°F | Ht 62.5 in | Wt 179.6 lb

## 2023-01-15 DIAGNOSIS — J4489 Other specified chronic obstructive pulmonary disease: Secondary | ICD-10-CM

## 2023-01-15 DIAGNOSIS — J4551 Severe persistent asthma with (acute) exacerbation: Secondary | ICD-10-CM

## 2023-01-15 MED ORDER — BENZONATATE 200 MG PO CAPS: 200.0000 mg | ORAL_CAPSULE | Freq: Three times a day (TID) | ORAL | 3 refills | Status: DC | PRN

## 2023-01-15 MED ORDER — PREDNISONE 10 MG PO TABS: ORAL_TABLET | ORAL | 0 refills | Status: DC

## 2023-01-15 MED ORDER — METHYLPREDNISOLONE ACETATE 80 MG/ML IJ SUSP
120.0000 mg | Freq: Once | INTRAMUSCULAR | Status: AC
Start: 2023-01-15 — End: 2023-01-15
  Administered 2023-01-15: 120 mg via INTRAMUSCULAR

## 2023-01-15 MED ORDER — AMOXICILLIN-POT CLAVULANATE 875-125 MG PO TABS: 1.0000 | ORAL_TABLET | Freq: Two times a day (BID) | ORAL | 0 refills | Status: DC

## 2023-01-15 NOTE — Progress Notes (Signed)
@Patient  ID: Kathryn Hicks, female    DOB: 1955-06-27, 67 y.o.   MRN: [id number]  Chief Complaint  Patient presents with   Acute Visit    Referring provider: Marylen Ponto, MD  HPI: 67 yo female former smoker followed for Asthma and Chronic cough   TEST/EVENTS :  Home apnea-link HST, AHI = 0.5, no apnea   PFT January 10, 2021 showed moderate restriction with FEV1 at 76%, ratio 90, FVC 64%, positive bronchodilator response, DLCO 106%.  01/15/2023 Acute OV : Asthma  Patient presents for an acute office visit.  Patient complains of ongoing cough, congestion and wheezing.  Complains of 3 weeks of cough ,congestion and wheezing . Initially called in prednisone taper on 12/26/22. Symptoms persisted and was called Augmentin and prednisone on 01/09/23.  She has  2 days left of her regimen.  Says that she is not feeling that much better.  She remains on Breztri twice daily.  Singulair and Allegra daily .  No fever or hemoptysis .  Coughing up thick green mucus. Taking mucinex Twice daily  .  Appetite is good with n/v/d.  Symptoms started after recent upper endoscopy. 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 Report not available. Patient says procedure went well , Varices were told stable. Did get esophageal dilation.     Allergies  Allergen Reactions   Butalbital Hives    Other reaction(s): Other (See Comments) unknown   Sporanos [Itraconazole] Hives and Rash    Welts full body    Immunization History  Administered Date(s) Administered   Fluad Quad(high Dose 65+) 02/07/2021   Influenza,inj,Quad PF,6+ Mos 02/05/2018   Moderna Sars-Covid-2 Vaccination 06/12/2019, 07/17/2019, 01/26/2020   PFIZER(Purple Top)SARS-COV-2 Vaccination 03/02/2021   Pneumococcal Polysaccharide-23 02/05/2018    Past Medical History:  Diagnosis Date   Anxiety    Depression    GERD (gastroesophageal reflux disease)    Hypertension    Hypothyroidism    followed by pcp   Migraines    OA  (osteoarthritis)    knees   Pneumonia    hx of    PONV (postoperative nausea and vomiting)    woke up during knee surgery    Wears glasses     Tobacco History: Social History   Tobacco Use  Smoking Status Former   Current packs/day: 0.00   Types: Cigarettes   Start date: 05/23/1967   Quit date: 05/22/1977   Years since quitting: 45.6   Passive exposure: Past  Smokeless Tobacco Never   Counseling given: Not Answered   Outpatient Medications Prior to Visit  Medication Sig Dispense Refill   albuterol (VENTOLIN HFA) 108 (90 Base) MCG/ACT inhaler Inhale 2 puffs into the lungs every 6 (six) hours as needed for wheezing or shortness of breath. 8 g 6   ALPRAZolam (XANAX) 1 MG tablet Take 1 mg by mouth at bedtime.     amoxicillin-clavulanate (AUGMENTIN) 875-125 MG tablet Take 1 tablet by mouth 2 (two) times daily. 14 tablet 0   atenolol (TENORMIN) 25 MG tablet Take 25 mg by mouth at bedtime.     Biotin 5000 MCG TABS Take 5,000 mcg by mouth daily.      BREZTRI AEROSPHERE 160-9-4.8 MCG/ACT AERO USE 2 INHALATIONS IN THE MORNING AND AT BEDTIME 32.1 g 3   Brimonidine Tartrate (LUMIFY) 0.025 % SOLN Place 1 drop into both eyes daily as needed (dry eyes).     cholecalciferol (VITAMIN D3) 25 MCG (1000 UT) tablet Take 1,000 Units by mouth daily.  diphenhydrAMINE (BENADRYL) 25 MG tablet Take 25 mg by mouth daily as needed for allergies.     esomeprazole (NEXIUM) 20 MG capsule Take 20 mg by mouth daily as needed.      estrogens, conjugated, (PREMARIN) 0.625 MG tablet Take 0.625 mg by mouth daily. Take daily for 21 days then do not take for 7 days.     furosemide (LASIX) 20 MG tablet Take 20 mg by mouth daily as needed for fluid or edema.     Lemborexant (DAYVIGO) 10 MG TABS Take 1 tablet by mouth at bedtime as needed (for sleep).     levothyroxine (SYNTHROID, LEVOTHROID) 25 MCG tablet Take 25 mcg by mouth daily before breakfast.     montelukast (SINGULAIR) 10 MG tablet Take 1 tablet (10 mg  total) by mouth at bedtime. 90 tablet 0   olmesartan-hydrochlorothiazide (BENICAR HCT) 40-25 MG tablet Take 1 tablet by mouth daily.     predniSONE (DELTASONE) 10 MG tablet 4 tabs for 2 days, then 3 tabs for 2 days, 2 tabs for 2 days, then 1 tab for 2 days, then stop 20 tablet 0   QUVIVIQ 50 MG TABS Take 1 tablet by mouth daily.     traMADol (ULTRAM) 50 MG tablet Take 50 mg by mouth every 6 (six) hours as needed.     zolpidem (AMBIEN) 5 MG tablet Take 5 mg by mouth at bedtime.     No facility-administered medications prior to visit.     Review of Systems:   Constitutional:   No  weight loss, night sweats,  Fevers, chills,  +fatigue, or  lassitude.  HEENT:   No headaches,  Difficulty swallowing,  Tooth/dental problems, or  Sore throat,                No sneezing, itching, ear ache, nasal congestion, post nasal drip,   CV:  No chest pain,  Orthopnea, PND, swelling in lower extremities, anasarca, dizziness, palpitations, syncope.   GI  No heartburn, indigestion, abdominal pain, nausea, vomiting, diarrhea, change in bowel habits, loss of appetite, bloody stools.   Resp: .  No chest wall deformity  Skin: no rash or lesions.  GU: no dysuria, change in color of urine, no urgency or frequency.  No flank pain, no hematuria   MS:  No joint pain or swelling.  No decreased range of motion.  No back pain.    Physical Exam  BP 116/68 (BP Location: Left Arm, Patient Position: Sitting, Cuff Size: Normal)   Pulse 71   Temp 98.1 F (36.7 C) (Oral)   Ht 5' 2.5" (1.588 m)   Wt 179 lb 9.6 oz (81.5 kg)   SpO2 97%   BMI 32.33 kg/m   GEN: A/Ox3; pleasant , NAD, well nourished    HEENT:  [place]/AT,   NOSE-clear, THROAT-clear, no lesions, no postnasal drip or exudate noted.   NECK:  Supple w/ fair ROM; no JVD; normal carotid impulses w/o bruits; no thyromegaly or nodules palpated; no lymphadenopathy.    RESP  scattered rhonchi  no accessory muscle use, no dullness to percussion  CARD:  RRR, no  m/r/g, no peripheral edema, pulses intact, no cyanosis or clubbing.  GI:   Soft & nt; nml bowel sounds; no organomegaly or masses detected.   Musco: Warm bil, no deformities or joint swelling noted.   Neuro: alert, no focal deficits noted.    Skin: Warm, no lesions or rashes    Lab Results:  CBC    Component  Value Date/Time   WBC 10.4 05/10/2021 1137   RBC 4.45 05/10/2021 1137   HGB 14.0 05/10/2021 1137   HCT 42.0 05/10/2021 1137   PLT 211.0 05/10/2021 1137   MCV 94.4 05/10/2021 1137   MCH 32.7 07/14/2020 0321   MCHC 33.3 05/10/2021 1137   RDW 13.6 05/10/2021 1137   LYMPHSABS 2.3 05/10/2021 1137   MONOABS 0.7 05/10/2021 1137   EOSABS 0.1 05/10/2021 1137   BASOSABS 0.0 05/10/2021 1137    BMET    Component Value Date/Time   NA 135 07/14/2020 0321   K 4.0 07/14/2020 0321   CL 100 07/14/2020 0321   CO2 26 07/14/2020 0321   GLUCOSE 145 (H) 07/14/2020 0321   BUN 15 07/14/2020 0321   CREATININE 0.81 07/14/2020 0321   CALCIUM 9.0 07/14/2020 0321   GFRNONAA >60 07/14/2020 0321   GFRAA >60 06/13/2018 0530    BNP No results found for: "BNP"  ProBNP No results found for: "PROBNP"  Imaging: No results found.  Administration History     None          Latest Ref Rng & Units 01/10/2021    3:29 PM  PFT Results  FVC-Pre L 1.73   FVC-Predicted Pre % 59   FVC-Post L 1.87   FVC-Predicted Post % 64   Pre FEV1/FVC % % 87   Post FEV1/FCV % % 90   FEV1-Pre L 1.51   FEV1-Predicted Pre % 67   FEV1-Post L 1.70   DLCO uncorrected ml/min/mmHg 19.72   DLCO UNC% % 106   DLCO corrected ml/min/mmHg 19.72   DLCO COR %Predicted % 106   DLVA Predicted % 126   TLC L 4.42   TLC % Predicted % 92   RV % Predicted % 129     No results found for: "NITRICOXIDE"      Assessment & Plan:   Asthma Slow to resolve Asthmatic bronchitis exacerbation - check chest xray today . Depo Medrol 120mg  IM injection in office  Add in cough control regimen with Mucinex DM liquid  along with Tessalon Perles.  Will extend out prednisone for a slow taper over the next 2 weeks.  Steroid discussion given. If continues to have recurrent exacerbations may need to look at biologic therapy.  Plan  Patient Instructions  Chest xray today  Depo Medrol shot  Extend Augmentin for 3 days -take with food  Prednisone 20mg  daily for 1 week and then 10mg  daily and then stop   Continue on Breztri Twice daily   Albuterol inhaler or neb As needed   Begin Liquid Mucinex DM 2 tsp Twice daily  As needed  cough  Tessalon Three times a day  for cough As needed   Voice rest , sips of water to soothe throat.  Follow up with Dr. Francine Graven as planned and As needed   Please contact office for sooner follow up if symptoms do not improve or worsen or seek emergency care       I spent   41 minutes dedicated to the care of this patient on the date of this encounter to include pre-visit review of records, face-to-face time with the patient discussing conditions above, post visit ordering of testing, clinical documentation with the electronic health record, making appropriate referrals as documented, and communicating necessary findings to members of the patients care team.   Rubye Oaks, NP 01/15/2023

## 2023-01-15 NOTE — Patient Instructions (Addendum)
Chest xray today  Depo Medrol shot  Extend Augmentin for 3 days -take with food  Prednisone 20mg  daily for 1 week and then 10mg  daily and then stop   Continue on Breztri Twice daily   Albuterol inhaler or neb As needed   Begin Liquid Mucinex DM 2 tsp Twice daily  As needed  cough  Tessalon Three times a day  for cough As needed   Voice rest , sips of water to soothe throat.  Follow up with Dr. Francine Graven as planned and As needed   Please contact office for sooner follow up if symptoms do not improve or worsen or seek emergency care

## 2023-01-15 NOTE — Addendum Note (Signed)
Addended by: Delrae Rend on: 01/15/2023 05:12 PM   Modules accepted: Orders

## 2023-01-15 NOTE — Assessment & Plan Note (Addendum)
Slow to resolve Asthmatic bronchitis exacerbation - check chest xray today . Depo Medrol 120mg  IM injection in office  Add in cough control regimen with Mucinex DM liquid along with Tessalon Perles.  Will extend out prednisone for a slow taper over the next 2 weeks.  Steroid discussion given. If continues to have recurrent exacerbations may need to look at biologic therapy.  Plan  Patient Instructions  Chest xray today  Depo Medrol shot  Extend Augmentin for 3 days -take with food  Prednisone 20mg  daily for 1 week and then 10mg  daily and then stop   Continue on Breztri Twice daily   Albuterol inhaler or neb As needed   Begin Liquid Mucinex DM 2 tsp Twice daily  As needed  cough  Tessalon Three times a day  for cough As needed   Voice rest , sips of water to soothe throat.  Follow up with Dr. Francine Graven as planned and As needed   Please contact office for sooner follow up if symptoms do not improve or worsen or seek emergency care

## 2023-02-10 ENCOUNTER — Other Ambulatory Visit: Payer: Self-pay | Admitting: Pulmonary Disease

## 2023-02-12 ENCOUNTER — Other Ambulatory Visit: Payer: Self-pay | Admitting: Nurse Practitioner

## 2023-02-12 DIAGNOSIS — K7460 Unspecified cirrhosis of liver: Secondary | ICD-10-CM

## 2023-02-13 ENCOUNTER — Inpatient Hospital Stay
Admission: RE | Admit: 2023-02-13 | Discharge: 2023-02-13 | Discharge status: Home / Self Care | Payer: Medicare Other | Source: Ambulatory Visit | Attending: Nurse Practitioner

## 2023-02-13 DIAGNOSIS — K7460 Unspecified cirrhosis of liver: Secondary | ICD-10-CM

## 2023-02-20 ENCOUNTER — Ambulatory Visit (INDEPENDENT_AMBULATORY_CARE_PROVIDER_SITE_OTHER): Payer: Medicare Other | Admitting: Pulmonary Disease

## 2023-02-20 ENCOUNTER — Encounter: Payer: Self-pay | Admitting: Pulmonary Disease

## 2023-02-20 VITALS — BP 116/64 | HR 72 | Temp 98.5°F | Ht 62.5 in | Wt 180.8 lb

## 2023-02-20 DIAGNOSIS — J4489 Other specified chronic obstructive pulmonary disease: Secondary | ICD-10-CM

## 2023-02-20 MED ORDER — BREZTRI AEROSPHERE 160-9-4.8 MCG/ACT IN AERO: 2.0000 | INHALATION_SPRAY | Freq: Two times a day (BID) | RESPIRATORY_TRACT | 3 refills | Status: DC

## 2023-02-20 MED ORDER — ALBUTEROL SULFATE (2.5 MG/3ML) 0.083% IN NEBU: 2.5000 mg | INHALATION_SOLUTION | Freq: Four times a day (QID) | RESPIRATORY_TRACT | 5 refills | Status: AC | PRN

## 2023-02-20 NOTE — Patient Instructions (Addendum)
Continue breztri inhaler 2 puffs twice daily with spacer - rinse mouth out after each use  Use albuterol 1-2 puffs every 4-6 hours  Try biotene mouthwash morning and even time  Follow up in 1 year, call sooner if needed

## 2023-02-20 NOTE — Progress Notes (Unsigned)
Synopsis: hx of asthma-COPD overlap  Subjective:   PATIENT ID: Kathryn Hicks GENDER: female DOB: [birth year], MRN: [id number]   HPI  Chief Complaint  Patient presents with   Follow-up    Improved.  No cough, congestion, no wheezing.  Mucinex liquid helped very much.   Kathryn Hicks, a 67 year old with a history of asthma-COPD overlap syndrome, presents for a follow-up visit.   She was last seen in the clinic about a month ago for an acute visit, during which she was treated with a prednisone taper and Augmentin. Her pulmonary function tests from October 2022 showed air trapping and a borderline significant bronchodilator response. Her chest x-ray from January 15, 2023, showed no acute cardiopulmonary disease with clear lungs bilaterally. Her peripheral eosinophil trend has been between 100 to 200. She is currently taking Breztri, two puffs twice daily, and as-needed albuterol.  She reports that she has had two flare-ups in the past year. She also reports that she experiences thrush as a side effect of her inhaler use, which she finds very uncomfortable. She has tried using a spacer in the past but did not have a good experience with it. She also reports that bending over can cause her to have difficulty breathing and to become hot and sweaty.  Past Medical History:  Diagnosis Date   Anxiety    Depression    GERD (gastroesophageal reflux disease)    Hypertension    Hypothyroidism    followed by pcp   Migraines    OA (osteoarthritis)    knees   Pneumonia    hx of    PONV (postoperative nausea and vomiting)    woke up during knee surgery    Wears glasses      Family History  Problem Relation Age of Onset   Prostate cancer Father    Heart attack Father    Wilm's tumor Daughter      Social History   Socioeconomic History   Marital status: Married    Spouse name: Not on file   Number of children: Not on file   Years of education: Not on file   Highest education level:  Not on file  Occupational History   Not on file  Tobacco Use   Smoking status: Former    Current packs/day: 0.00    Types: Cigarettes    Start date: 05/23/1967    Quit date: 05/22/1977    Years since quitting: 45.7    Passive exposure: Past   Smokeless tobacco: Never  Vaping Use   Vaping status: Never Used  Substance and Sexual Activity   Alcohol use: Never    Comment: rare   Drug use: Never   Sexual activity: Not on file  Other Topics Concern   Not on file  Social History Narrative   Not on file   Social Determinants of Health   Financial Resource Strain: Not on file  Food Insecurity: Low Risk  (05/29/2022)   [facility]   Hunger Vital Sign    Worried About Running Out of Food in the Last Year: Never true    Ran Out of Food in the Last Year: Never true  Transportation Needs: No Transportation Needs (05/29/2022)   [facility]   Transportation    In the past 12 months, has lack of reliable transportation kept you from medical appointments, meetings, work or from getting things needed for daily living? : No  Physical Activity: Not on  file  Stress: Not on file  Social Connections: Not on file  Intimate Partner Violence: Not on file     Allergies  Allergen Reactions   Butalbital Hives    Other reaction(s): Other (See Comments) unknown   NUUVOZD [Daridorexant] Other (See Comments)    Nightmares   Sporanos [Itraconazole] Hives and Rash    Welts full body     Outpatient Medications Prior to Visit  Medication Sig Dispense Refill   albuterol (VENTOLIN HFA) 108 (90 Base) MCG/ACT inhaler Inhale 2 puffs into the lungs every 6 (six) hours as needed for wheezing or shortness of breath. 8 g 6   ALPRAZolam (XANAX) 1 MG tablet Take 1 mg by mouth at bedtime.     atenolol (TENORMIN) 25 MG tablet Take 25 mg by mouth at bedtime.     Biotin 5000 MCG TABS Take 5,000 mcg by mouth daily.      Brimonidine Tartrate (LUMIFY)  0.025 % SOLN Place 1 drop into both eyes daily as needed (dry eyes).     cholecalciferol (VITAMIN D3) 25 MCG (1000 UT) tablet Take 1,000 Units by mouth daily.     esomeprazole (NEXIUM) 20 MG capsule Take 20 mg by mouth daily as needed.      estrogens, conjugated, (PREMARIN) 0.625 MG tablet Take 0.625 mg by mouth daily. Take daily for 21 days then do not take for 7 days.     fexofenadine (ALLEGRA) 180 MG tablet Take 180 mg by mouth daily.     furosemide (LASIX) 20 MG tablet Take 20 mg by mouth daily as needed for fluid or edema.     Lemborexant (DAYVIGO) 10 MG TABS Take 1 tablet by mouth at bedtime as needed (for sleep).     levothyroxine (SYNTHROID, LEVOTHROID) 25 MCG tablet Take 25 mcg by mouth daily before breakfast.     montelukast (SINGULAIR) 10 MG tablet TAKE 1 TABLET AT BEDTIME 90 tablet 3   olmesartan-hydrochlorothiazide (BENICAR HCT) 40-25 MG tablet Take 1 tablet by mouth daily.     traMADol (ULTRAM) 50 MG tablet Take 50 mg by mouth every 6 (six) hours as needed.     albuterol (PROVENTIL) (2.5 MG/3ML) 0.083% nebulizer solution Take 2.5 mg by nebulization every 6 (six) hours as needed for wheezing or shortness of breath.     BREZTRI AEROSPHERE 160-9-4.8 MCG/ACT AERO USE 2 INHALATIONS IN THE MORNING AND AT BEDTIME (Patient taking differently: Take 2 puffs by mouth 2 (two) times daily as needed (for cough or wheeze).) 32.1 g 3   amoxicillin-clavulanate (AUGMENTIN) 875-125 MG tablet Take 1 tablet by mouth 2 (two) times daily. 14 tablet 0   amoxicillin-clavulanate (AUGMENTIN) 875-125 MG tablet Take 1 tablet by mouth 2 (two) times daily. 6 tablet 0   benzonatate (TESSALON) 200 MG capsule Take 1 capsule (200 mg total) by mouth 3 (three) times daily as needed. 45 capsule 3   diphenhydrAMINE (BENADRYL) 25 MG tablet Take 25 mg by mouth daily as needed for allergies.     predniSONE (DELTASONE) 10 MG tablet 4 tabs for 2 days, then 3 tabs for 2 days, 2 tabs for 2 days, then 1 tab for 2 days, then stop 20  tablet 0   predniSONE (DELTASONE) 10 MG tablet 2 tabs for 7 days, then 1 tab for 7 days, then stop 21 tablet 0   QUVIVIQ 50 MG TABS Take 1 tablet by mouth daily.     zolpidem (AMBIEN) 5 MG tablet Take 5 mg by mouth at bedtime.  No facility-administered medications prior to visit.    Review of Systems  Constitutional:  Negative for chills, fever, malaise/fatigue and weight loss.  HENT:  Negative for congestion, sinus pain and sore throat.   Eyes: Negative.   Respiratory:  Positive for shortness of breath. Negative for cough, hemoptysis, sputum production and wheezing.   Cardiovascular:  Negative for chest pain, palpitations, orthopnea, claudication and leg swelling.  Gastrointestinal:  Negative for abdominal pain, heartburn, nausea and vomiting.  Genitourinary: Negative.   Musculoskeletal:  Negative for joint pain and myalgias.  Skin:  Negative for rash.  Neurological:  Negative for weakness.  Endo/Heme/Allergies: Negative.   Psychiatric/Behavioral: Negative.     Objective:   Vitals:   02/20/23 1504  BP: 116/64  Pulse: 72  Temp: 98.5 F (36.9 C)  TempSrc: Oral  SpO2: 96%  Weight: 180 lb 12.8 oz (82 kg)  Height: 5' 2.5" (1.588 m)   Physical Exam Constitutional:      General: She is not in acute distress.    Appearance: Normal appearance.  Eyes:     General: No scleral icterus.    Conjunctiva/sclera: Conjunctivae normal.  Cardiovascular:     Rate and Rhythm: Normal rate and regular rhythm.  Pulmonary:     Breath sounds: No wheezing, rhonchi or rales.  Musculoskeletal:     Right lower leg: No edema.     Left lower leg: No edema.  Skin:    General: Skin is warm and dry.  Neurological:     General: No focal deficit present.    CBC    Component Value Date/Time   WBC 10.4 05/10/2021 1137   RBC 4.45 05/10/2021 1137   HGB 14.0 05/10/2021 1137   HCT 42.0 05/10/2021 1137   PLT 211.0 05/10/2021 1137   MCV 94.4 05/10/2021 1137   MCH 32.7 07/14/2020 0321   MCHC  33.3 05/10/2021 1137   RDW 13.6 05/10/2021 1137   LYMPHSABS 2.3 05/10/2021 1137   MONOABS 0.7 05/10/2021 1137   EOSABS 0.1 05/10/2021 1137   BASOSABS 0.0 05/10/2021 1137      Latest Ref Rng & Units 07/14/2020    3:21 AM 07/02/2020    2:45 PM 06/13/2018    5:30 AM  BMP  Glucose 70 - 99 mg/dL 161  096  045   BUN 8 - 23 mg/dL 15  14  6    Creatinine 0.44 - 1.00 mg/dL 4.09  8.11  9.14   Sodium 135 - 145 mmol/L 135  137  129   Potassium 3.5 - 5.1 mmol/L 4.0  3.5  3.8   Chloride 98 - 111 mmol/L 100  104  92   CO2 22 - 32 mmol/L 26  24  30    Calcium 8.9 - 10.3 mg/dL 9.0  9.5  8.5    Chest imaging: CXR 01/15/23 The heart size and mediastinal contours are within normal limits. Elevation of the right hemidiaphragm. Both lungs are clear. The visualized skeletal structures are unremarkable.  PFT:    Latest Ref Rng & Units 01/10/2021    3:29 PM  PFT Results  FVC-Pre L 1.73   FVC-Predicted Pre % 59   FVC-Post L 1.87   FVC-Predicted Post % 64   Pre FEV1/FVC % % 87   Post FEV1/FCV % % 90   FEV1-Pre L 1.51   FEV1-Predicted Pre % 67   FEV1-Post L 1.70   DLCO uncorrected ml/min/mmHg 19.72   DLCO UNC% % 106   DLCO corrected ml/min/mmHg 19.72  DLCO COR %Predicted % 106   DLVA Predicted % 126   TLC L 4.42   TLC % Predicted % 92   RV % Predicted % 129     Labs:  Path:  Echo:  Heart Catheterization:    Assessment & Plan:   Asthma-COPD overlap syndrome (HCC) - Plan: Budeson-Glycopyrrol-Formoterol (BREZTRI AEROSPHERE) 160-9-4.8 MCG/ACT AERO, albuterol (PROVENTIL) (2.5 MG/3ML) 0.083% nebulizer solution  Discussion: Kathryn Hicks, a 67 year old with a history of asthma-COPD overlap syndrome, presents for a follow-up visit.   Asthma-COPD Overlap Syndrome Recent exacerbation managed with prednisone taper and Augmentin. Patient reports improvement. Discussed the importance of daily use of Breztri as a maintenance inhaler to prevent flare-ups and potential lung damage. Patient also  reports oral thrush, a common side effect of inhaled corticosteroids. -Continue Breztri 2 puffs twice daily. -Consider use of spacer device to reduce oral thrush. -Consider use of Biotene mouthwash to manage oral symptoms -Refill prescription for nebulizer solution via [facility]. -Schedule follow-up in 1 year or sooner if symptoms worsen.    Current Outpatient Medications:    albuterol (VENTOLIN HFA) 108 (90 Base) MCG/ACT inhaler, Inhale 2 puffs into the lungs every 6 (six) hours as needed for wheezing or shortness of breath., Disp: 8 g, Rfl: 6   ALPRAZolam (XANAX) 1 MG tablet, Take 1 mg by mouth at bedtime., Disp: , Rfl:    atenolol (TENORMIN) 25 MG tablet, Take 25 mg by mouth at bedtime., Disp: , Rfl:    Biotin 5000 MCG TABS, Take 5,000 mcg by mouth daily. , Disp: , Rfl:    Brimonidine Tartrate (LUMIFY) 0.025 % SOLN, Place 1 drop into both eyes daily as needed (dry eyes)., Disp: , Rfl:    cholecalciferol (VITAMIN D3) 25 MCG (1000 UT) tablet, Take 1,000 Units by mouth daily., Disp: , Rfl:    esomeprazole (NEXIUM) 20 MG capsule, Take 20 mg by mouth daily as needed. , Disp: , Rfl:    estrogens, conjugated, (PREMARIN) 0.625 MG tablet, Take 0.625 mg by mouth daily. Take daily for 21 days then do not take for 7 days., Disp: , Rfl:    fexofenadine (ALLEGRA) 180 MG tablet, Take 180 mg by mouth daily., Disp: , Rfl:    furosemide (LASIX) 20 MG tablet, Take 20 mg by mouth daily as needed for fluid or edema., Disp: , Rfl:    Lemborexant (DAYVIGO) 10 MG TABS, Take 1 tablet by mouth at bedtime as needed (for sleep)., Disp: , Rfl:    levothyroxine (SYNTHROID, LEVOTHROID) 25 MCG tablet, Take 25 mcg by mouth daily before breakfast., Disp: , Rfl:    montelukast (SINGULAIR) 10 MG tablet, TAKE 1 TABLET AT BEDTIME, Disp: 90 tablet, Rfl: 3   olmesartan-hydrochlorothiazide (BENICAR HCT) 40-25 MG tablet, Take 1 tablet by mouth daily., Disp: , Rfl:    traMADol (ULTRAM) 50 MG tablet, Take 50 mg by mouth every  6 (six) hours as needed., Disp: , Rfl:    albuterol (PROVENTIL) (2.5 MG/3ML) 0.083% nebulizer solution, Take 3 mLs (2.5 mg total) by nebulization every 6 (six) hours as needed for wheezing or shortness of breath., Disp: 120 mL, Rfl: 5   Budeson-Glycopyrrol-Formoterol (BREZTRI AEROSPHERE) 160-9-4.8 MCG/ACT AERO, Inhale 2 puffs into the lungs 2 (two) times daily., Disp: 32.1 g, Rfl: 3

## 2023-02-21 ENCOUNTER — Encounter: Payer: Self-pay | Admitting: Pulmonary Disease

## 2023-07-23 ENCOUNTER — Ambulatory Visit: Payer: Self-pay | Admitting: Pulmonary Disease

## 2023-07-23 MED ORDER — PREDNISONE 20 MG PO TABS: 20.0000 mg | ORAL_TABLET | Freq: Every day | ORAL | 0 refills | Status: DC

## 2023-07-23 MED ORDER — AZITHROMYCIN 250 MG PO TABS: ORAL_TABLET | ORAL | 0 refills | Status: AC

## 2023-07-23 MED ORDER — PREDNISONE 20 MG PO TABS
20.0000 mg | ORAL_TABLET | Freq: Every day | ORAL | 0 refills | Status: DC
Start: 2023-07-23 — End: 2023-10-15

## 2023-07-23 NOTE — Addendum Note (Signed)
 Addended by: Drema Genta on: 07/23/2023 03:48 PM   Modules accepted: Orders

## 2023-07-23 NOTE — Telephone Encounter (Signed)
 Asthma Flare with possible URI +/- allergic rhinitis -   If Sx are not improving with Saline nasal rinses, Claritin daily As needed  /Mucinex DM Twice daily   May start Zpack # 1 , take as directed and Prednisone  20mg  daily for 5 days   Please contact office for sooner follow up if symptoms do not improve or worsen or seek emergency care  Make sure she has follow up with Dr. Diania Fortes

## 2023-07-23 NOTE — Telephone Encounter (Signed)
 Chief Complaint: SOB Symptoms: nasal drainage, dry cough, fatigue Frequency: x 1 day Pertinent Negatives: Patient denies fever, CP, severe SOB Disposition: [] ED /[] Urgent Care (no appt availability in office) / [x] Appointment(In office/virtual)/ []  [facility]/ [] Home Care/ [] Refused Recommended Disposition /[] [facility]/ [x]  Follow-up with Pulm Additional Notes: Pt c/o SOB, dry cough, and nasal drainage that has worsened x 1 day. Pt reports husband was recently sick with "head cold" but recovered and pt is experiencing sx that have worsened. Pt endorses dry cough, feeling cold, fatigue, and mild SOB. Pt reports taking INH as prescribed. Triager reinforced albuterol  usage and to take a tx now. Triager also asked pt to take at-home COVID/Flu test and to call back with positive results. Triager attempted to schedule per protocol, but no access. Triager will forward encounter for Dr. Diania Fortes to review and advise. Patient verbalized understanding and is expecting call back from office for next steps. Triager also advised that if pt does not hear back from office, to follow disposition for further evaluation/treatment with PCP or UC.    Copied from CRM [id number]. Topic: Clinical - Red Word Triage >> Jul 23, 2023  1:04 PM Crist Dominion wrote: Red Word that prompted transfer to Nurse Triage: Head cold that has gone down to lungs and is causing chest heaviness / trouble breathing. Reason for Disposition  [1] MILD asthma attack (e.g., no SOB at rest, mild SOB with walking, speaks normally in sentences, mild wheezing) AND [2] lasting > 24 hours on prescribed treatment  Answer Assessment - Initial Assessment Questions E2C2 Pulmonary Triage - Initial Assessment Questions "Chief Complaint (e.g., cough, sob, wheezing, fever, chills, sweat or additional symptoms) *Go to specific symptom protocol after initial questions. Trouble breathing - pt reports husband got a "head cold" C/o "chest  heaviness" "less air going in"  Fatigue Shivering  "How long have symptoms been present?" Worsening X 1 day  Have you tested for COVID or Flu? Note: If not, ask patient if a home test can be taken. If so, instruct patient to call back for positive results. No,   MEDICINES:   "Have you used any OTC meds to help with symptoms?" No If yes, ask "What medications?" Tussin DM max - provides relief  "Have you used your inhalers/maintenance medication?" Yes If yes, "What medications?" Breztri  - 2 puffs twice daily Albuterol  PRN - last taken the other night, not used very often  If inhaler, ask "How many puffs and how often?" Note: Review instructions on medication in the chart. See above  OXYGEN : "Do you wear supplemental oxygen ?" No If yes, "How many liters are you supposed to use?" denies  "Do you monitor your oxygen  levels?" Yes If yes, "What is your reading (oxygen  level) today?" 95%  "What is your usual oxygen  saturation reading?"  (Note: Pulmonary O2 sats should be 90% or greater) > 95%   1. RESPIRATORY STATUS: "Describe your breathing?" (e.g., wheezing, shortness of breath, unable to speak, severe coughing)      SOB 2. ONSET: "When did this breathing problem begin?"      X 1 day 3. PATTERN "Does the difficult breathing come and go, or has it been constant since it started?"      constant 4. SEVERITY: "How bad is your breathing?" (e.g., mild, moderate, severe)    - MILD: No SOB at rest, mild SOB with walking, speaks normally in sentences, can lie down, no retractions, pulse < 100.    - MODERATE: SOB at rest,  SOB with minimal exertion and prefers to sit, cannot lie down flat, speaks in phrases, mild retractions, audible wheezing, pulse 100-120.    - SEVERE: Very SOB at rest, speaks in single words, struggling to breathe, sitting hunched forward, retractions, pulse > 120      Mild- moderate Triager appreciates pt speaking in full sentences 5. RECURRENT SYMPTOM: "Have you  had difficulty breathing before?" If Yes, ask: "When was the last time?" and "What happened that time?"      Yes, usually takes abx and steroids 6. CARDIAC HISTORY: "Do you have any history of heart disease?" (e.g., heart attack, angina, bypass surgery, angioplasty)      Denies Endorses taking BP meds 7. LUNG HISTORY: "Do you have any history of lung disease?"  (e.g., pulmonary embolus, asthma, emphysema)     asthma 8. CAUSE: "What do you think is causing the breathing problem?"      exacerbation 9. OTHER SYMPTOMS: "Do you have any other symptoms? (e.g., dizziness, runny nose, cough, chest pain, fever)     Runny nose Denies fever, CP  Protocols used: Breathing Difficulty-A-AH, Asthma Attack-A-AH

## 2023-07-24 NOTE — Telephone Encounter (Signed)
 Pt.notified

## 2023-08-16 ENCOUNTER — Other Ambulatory Visit (HOSPITAL_BASED_OUTPATIENT_CLINIC_OR_DEPARTMENT_OTHER): Payer: Self-pay | Admitting: Nurse Practitioner

## 2023-08-16 DIAGNOSIS — K7460 Unspecified cirrhosis of liver: Secondary | ICD-10-CM

## 2023-08-16 DIAGNOSIS — K766 Portal hypertension: Secondary | ICD-10-CM

## 2023-08-17 ENCOUNTER — Ambulatory Visit (INDEPENDENT_AMBULATORY_CARE_PROVIDER_SITE_OTHER)
Admission: RE | Admit: 2023-08-17 | Discharge: 2023-08-17 | Disposition: A | Discharge status: Home / Self Care | Source: Ambulatory Visit | Attending: Nurse Practitioner | Admitting: Nurse Practitioner

## 2023-08-17 DIAGNOSIS — K766 Portal hypertension: Secondary | ICD-10-CM

## 2023-08-17 DIAGNOSIS — K7460 Unspecified cirrhosis of liver: Secondary | ICD-10-CM

## 2023-10-15 ENCOUNTER — Ambulatory Visit: Payer: Self-pay

## 2023-10-15 DIAGNOSIS — J4551 Severe persistent asthma with (acute) exacerbation: Secondary | ICD-10-CM

## 2023-10-15 MED ORDER — PREDNISONE 10 MG PO TABS: ORAL_TABLET | ORAL | 0 refills | Status: AC

## 2023-10-15 NOTE — Telephone Encounter (Signed)
 Prednisone  taper sent for asthma exacerbation  Kathryn Hicks

## 2023-10-15 NOTE — Telephone Encounter (Signed)
 FYI Only or Action Required?: Action required by provider: Rx request.  Patient is followed in Pulmonology for Asthma/COPD, last seen on 02/20/2023 by Kara Dorn NOVAK, MD.  Called Nurse Triage reporting Asthma.  Symptoms began several days ago.  Interventions attempted: Rescue inhaler and Maintenance inhaler.  Symptoms are: unchanged.  Triage Disposition: Call Specialist Today  Patient/caregiver understands and will follow disposition?: Yes      Copied from CRM [id number]. Topic: Clinical - Red Word Triage >> Oct 15, 2023  9:04 AM Benton O wrote: [facility] that prompted transfer to Nurse Triage: flare with lungs difficult to breath inflammation coming up and wants tammy or dr dewald to call her in something Reason for Disposition  [1] Continuous (nonstop) coughing AND [2] keeps from working or sleeping AND [3] not improved after 2 or 3 inhaler or nebulizer treatments given 20 minutes apart  Answer Assessment - Initial Assessment Questions E2C2 Pulmonary Triage - Initial Assessment Questions Chief Complaint (e.g., cough, sob, wheezing, fever, chills, sweat or additional symptoms) *Go to specific symptom protocol after initial questions. Breathing flare, wheezing, productive light colored cough  How long have symptoms been present? X 5 days  Have you tested for COVID or Flu? Note: If not, ask patient if a home test can be taken. If so, instruct patient to call back for positive results. No  MEDICINES:   Have you used any OTC meds to help with symptoms? No If yes, ask What medications?   Have you used your inhalers/maintenance medication? Yes If yes, What medications? Breztri  - 2 puffs twice a day Albuterol  INH - has been using last 2-3 days, about 2-3x a day - has not taken this AM Triager reviewed albuterol  usage-- triage advised albuterol  NEB now.  If inhaler, ask How many puffs and how often? Note: Review instructions on medication in the chart. See  above  OXYGEN : Do you wear supplemental oxygen ? No If yes, How many liters are you supposed to use? N/a  Do you monitor your oxygen  levels? Yes If yes, What is your reading (oxygen  level) today? 91-95  What is your usual oxygen  saturation reading?  (Note: Pulmonary O2 sats should be 90% or greater) 97-98     1. RESPIRATORY STATUS: Describe your breathing? (e.g., wheezing, shortness of breath, unable to speak, severe coughing)      Wheezing, sob 2. ONSET: When did this asthma attack begin?      See above 3. TRIGGER: What do you think triggered this attack? (e.g., URI, exposure to pollen or other allergen, tobacco smoke)      Unknown 4. PEAK EXPIRATORY FLOW RATE (PEFR): Do you use a peak flow meter? If Yes, ask: What's the current peak flow? What's your personal best peak flow?      N/a 5. SEVERITY: How bad is this attack?      Last flare was few months ago - current flare feels same as last flare 6. ASTHMA MEDICINES:  What treatments have you tried?      See above 7. INHALED QUICK-RELIEF TREATMENTS FOR THIS ATTACK: What treatments have you given yourself so far? and How many and how often? If using an inhaler, ask, How many puffs? Note: Routine treatments are 2 puffs every 4 hours as needed. Rescue treatments are 4 puffs repeated every 20 minutes, up to three times as needed.      See above 8. OTHER SYMPTOMS: Do you have any other symptoms? (e.g., chest pain, coughing up yellow sputum, fever, runny nose)  Productive cough Triager does appreciate audible SOB/wheezing during call. Pt is speaking in partial-full sentences.  9. O2 SATURATION MONITOR:  Do you use an oxygen  saturation monitor (pulse oximeter) at home? If Yes, What is your reading (oxygen  level) today? What is your usual oxygen  saturation reading? (e.g., 95%)     See above 10. PREGNANCY: Is there any chance you are pregnant? When was your last menstrual period?       N/a     Triager attempted to schedule acute visit, but pt is unable to make acute appt. Pt requesting Rx to [facility] on file. Triager will forward encounter for Dr. Kara 's office to review and advise. Patient verbalized understanding and is expecting call back from office for next steps. Triager also advised that if pt does not hear back from office, to follow disposition for further evaluation/treatment.  Protocols used: Asthma Attack-A-AH

## 2023-10-15 NOTE — Telephone Encounter (Signed)
 Dr.Dewald can you please advise.

## 2023-10-16 NOTE — Telephone Encounter (Signed)
 Called and spoke with the patient regarding prednisone  taper sent to pharm. Pt is aware & verbalized understanding.

## 2023-12-17 ENCOUNTER — Ambulatory Visit: Payer: Self-pay

## 2023-12-17 NOTE — Telephone Encounter (Signed)
 FYI Only or Action Required?: Action required by provider: Please see notes and advise if patient needs to be seen.  Patient is followed in Pulmonology for Asthma/COPD, last seen on 02/20/2023 by Kara Dorn NOVAK, MD.  Called Nurse Triage reporting Shortness of Breath.  Symptoms began several days ago.  Interventions attempted: Maintenance inhaler and Nebulizer treatments.  Symptoms are: stable.  Triage Disposition: See PCP When Office is Open (Within 3 Days)  Patient/caregiver understands and will follow disposition?: Yes     Copied from CRM #[id number]. Topic: Clinical - Red Word Triage >> Dec 17, 2023  8:06 AM Russell PARAS wrote: Red Word that prompted transfer to Nurse Triage:   Symptoms started about 4 days ago  SOB Wheezing Coughing frequently at night Phlegm is slightly colored  Had UTI that turned into a head cold.  Now feels like it has moved into chest Reason for Disposition  [1] MODERATE longstanding difficulty breathing (e.g., speaks in phrases, SOB even at rest, pulse 100-120) AND [2] SAME as normal  Answer Assessment - Initial Assessment Questions Patient had a UTI and was seen on the 9th and prescribed nitrofurantoin - has been taking as prescribed. Patient states she is calling to see if she needs to come in because she was sick not long ago. She states she took extra Prednisone  she had at home and it did not help. Patient is asking if she needs medication called in or does she need to come? Patient cannot be seen until after 1 pm today.   1. RESPIRATORY STATUS: Describe your breathing? (e.g., wheezing, shortness of breath, unable to speak, severe coughing)      Wheezing, shortness of breath, coughing at night, having to prop herself up  2. ONSET: When did this breathing problem begin?      4 days ago 3. PATTERN Does the difficult breathing come and go, or has it been constant since it started?      Patient states she takes medicine but the SOB comes back  with a lot of talking or exertion 4. SEVERITY: How bad is your breathing? (e.g., mild, moderate, severe)      I would have a hard time walking up steps 5. RECURRENT SYMPTOM: Have you had difficulty breathing before? If Yes, ask: When was the last time? and What happened that time?      Yes - patient states when it does happen it comes on quick 7. LUNG HISTORY: Do you have any history of lung disease?  (e.g., pulmonary embolus, asthma, emphysema)     Asthma, COPD 8. CAUSE: What do you think is causing the breathing problem?      Head cold that is now into chest 9. OTHER SYMPTOMS: Do you have any other symptoms? (e.g., chest pain, cough, dizziness, fever, runny nose)     SOB, wheezing, coughing, yellow phlegm 10. O2 SATURATION MONITOR:  Do you use an oxygen  saturation monitor (pulse oximeter) at home? If Yes, ask: What is your reading (oxygen  level) today? What is your usual oxygen  saturation reading? (e.g., 95%)       Oxygen  level currently after nebulizer treatment is 98%  Protocols used: Breathing Difficulty-A-AH

## 2023-12-18 ENCOUNTER — Encounter: Payer: Self-pay | Admitting: Pulmonary Disease

## 2023-12-18 ENCOUNTER — Ambulatory Visit (INDEPENDENT_AMBULATORY_CARE_PROVIDER_SITE_OTHER)

## 2023-12-18 ENCOUNTER — Ambulatory Visit: Admitting: Pulmonary Disease

## 2023-12-18 VITALS — BP 134/74 | HR 75 | Temp 97.4°F | Ht 62.0 in | Wt 176.0 lb

## 2023-12-18 DIAGNOSIS — R0602 Shortness of breath: Secondary | ICD-10-CM | POA: Diagnosis not present

## 2023-12-18 MED ORDER — PREDNISONE 20 MG PO TABS: 40.0000 mg | ORAL_TABLET | Freq: Every day | ORAL | 0 refills | Status: AC

## 2023-12-18 MED ORDER — HYDROCODONE BIT-HOMATROP MBR 5-1.5 MG/5ML PO SOLN: 5.0000 mL | Freq: Four times a day (QID) | ORAL | 0 refills | Status: DC | PRN

## 2023-12-18 MED ORDER — AMOXICILLIN-POT CLAVULANATE 875-125 MG PO TABS: 1.0000 | ORAL_TABLET | Freq: Two times a day (BID) | ORAL | 0 refills | Status: DC

## 2023-12-18 NOTE — Patient Instructions (Addendum)
 We will get a chest x-ray today  Prescription for Augmentin  sent to pharmacy for you  Prescription for prednisone  sent to pharmacy for you  Continue using your Breztri   Use your nebulizer as needed  I did send in a prescription for cough medicine for you as well  Tentative follow-up in about 6 weeks

## 2023-12-18 NOTE — Progress Notes (Signed)
 Kathryn Hicks    [id number]    October 15, 1955  Primary Care Physician:Holt, Kathryn RAMAN, MD  Referring Physician: Ina Kathryn RAMAN, MD [address] Kathryn Hicks [id number],   Chief complaint:   Patient being seen for an acute visit  HPI:  Patient came in with about a week history of worsening shortness of breath cough, chest tightness Has not been able to sleep well  Does have a history of severe persistent asthma  Uses Breztri  on a regular basis  Coughing, not really bringing up a whole lot of mucus but increasingly more short of breath  Was recently treated for UTI  She has a past history of asthma COPD overlap  Social smoking in the past quit many years ago Was exposed to a lot of secondhand smoke as a child  No pertinent occupational history  Outpatient Encounter Medications as of 12/18/2023  Medication Sig   albuterol  (PROVENTIL ) (2.5 MG/3ML) 0.083% nebulizer solution Take 3 mLs (2.5 mg total) by nebulization every 6 (six) hours as needed for wheezing or shortness of breath.   albuterol  (VENTOLIN  HFA) 108 (90 Base) MCG/ACT inhaler Inhale 2 puffs into the lungs every 6 (six) hours as needed for wheezing or shortness of breath.   ALPRAZolam  (XANAX ) 1 MG tablet Take 1 mg by mouth at bedtime.   atenolol (TENORMIN) 25 MG tablet Take 25 mg by mouth at bedtime.   Biotin 5000 MCG TABS Take 5,000 mcg by mouth daily.    Brimonidine  Tartrate (LUMIFY ) 0.025 % SOLN Place 1 drop into both eyes daily as needed (dry eyes).   Budeson-Glycopyrrol-Formoterol (BREZTRI  AEROSPHERE) 160-9-4.8 MCG/ACT AERO Inhale 2 puffs into the lungs 2 (two) times daily.   cholecalciferol (VITAMIN D3) 25 MCG (1000 UT) tablet Take 1,000 Units by mouth daily.   esomeprazole (NEXIUM) 20 MG capsule Take 20 mg by mouth daily as needed.    estrogens , conjugated, (PREMARIN ) 0.625 MG tablet Take 0.625 mg by mouth daily. Take daily for 21 days then do not take for 7 days.   fexofenadine (ALLEGRA) 180 MG  tablet Take 180 mg by mouth daily.   furosemide (LASIX) 20 MG tablet Take 20 mg by mouth daily as needed for fluid or edema.   Lemborexant (DAYVIGO) 10 MG TABS Take 1 tablet by mouth at bedtime as needed (for sleep).   levothyroxine  (SYNTHROID , LEVOTHROID) 25 MCG tablet Take 25 mcg by mouth daily before breakfast.   montelukast  (SINGULAIR ) 10 MG tablet TAKE 1 TABLET AT BEDTIME   olmesartan -hydrochlorothiazide  (BENICAR  HCT) 40-25 MG tablet Take 1 tablet by mouth daily.   traMADol (ULTRAM) 50 MG tablet Take 50 mg by mouth every 6 (six) hours as needed.   No facility-administered encounter medications on file as of 12/18/2023.    Allergies as of 12/18/2023 - Review Complete 12/18/2023  Allergen Reaction Noted   Butalbital Hives 07/05/2015   Quviviq [daridorexant] Other (See Comments) 02/20/2023   Sporanos [itraconazole] Hives and Rash 05/14/2018    Past Medical History:  Diagnosis Date   Anxiety    Depression    GERD (gastroesophageal reflux disease)    Hypertension    Hypothyroidism    followed by pcp   Migraines    OA (osteoarthritis)    knees   Pneumonia    hx of    PONV (postoperative nausea and vomiting)    woke up during knee surgery    Wears glasses     Past Surgical History:  Procedure Laterality  Date   ABDOMINAL HYSTERECTOMY  2003    w/  Bilateral Salpingo-oophorectomy   CARPAL TUNNEL RELEASE Left 2014   CESAREAN SECTION  x2  last one 1981   COLONOSCOPY     KNEE ARTHROSCOPY Left 06-21-2017   dr heide   LAPAROSCOPIC CHOLECYSTECTOMY  2004   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   Procedure: TOTAL KNEE ARTHROPLASTY;  Surgeon: heide Ingle, MD;  Location: [facility];  Service: Orthopedics;  Laterality: Left;    TOTAL KNEE ARTHROPLASTY Right 07/13/2020   Procedure: TOTAL KNEE ARTHROPLASTY;  Surgeon: Ernie Cough, MD;  Location: [facility];  Service: Orthopedics;  Laterality: Right;  70 mins    Family History  Problem Relation Age of Onset   Prostate cancer  Father    Heart attack Father    Wilm's tumor Daughter     Social History   Socioeconomic History   Marital status: Married    Spouse name: Not on file   Number of children: Not on file   Years of education: Not on file   Highest education level: Not on file  Occupational History   Not on file  Tobacco Use   Smoking status: Former    Current packs/day: 0.00    Types: Cigarettes    Start date: 05/23/1967    Quit date: 05/22/1977    Years since quitting: 46.6    Passive exposure: Past   Smokeless tobacco: Never  Vaping Use   Vaping status: Never Used  Substance and Sexual Activity   Alcohol use: Never    Comment: rare   Drug use: Never   Sexual activity: Not on file  Other Topics Concern   Not on file  Social History Narrative   Not on file   Social Drivers of Health   Financial Resource Strain: Not on file  Food Insecurity: Low Risk  (05/29/2022)   Received from [facility]   Hunger Vital Sign    Within the past 12 months, you worried that your food would run out before you got money to buy more: Never true    Within the past 12 months, the food you bought just didn't last and you didn't have money to get more. : Never true  Transportation Needs: No Transportation Needs (05/29/2022)   Received from [facility]    In the past 12 months, has lack of reliable transportation kept you from medical appointments, meetings, work or from getting things needed for daily living? : No  Physical Activity: Not on file  Stress: Not on file  Social Connections: Not on file  Intimate Partner Violence: Not on file    Review of Systems  Respiratory:  Positive for cough, chest tightness, shortness of breath and wheezing.   Psychiatric/Behavioral:  The patient is nervous/anxious.     Vitals:   12/18/23 1509  BP: 134/74  Pulse: 75  Temp: (!) 97.4 F (36.3 C)  SpO2: 98%     Physical Exam Constitutional:      Appearance: She is obese.  HENT:     Head:  Normocephalic.     Mouth/Throat:     Mouth: Mucous membranes are moist.  Eyes:     General: No scleral icterus. Cardiovascular:     Rate and Rhythm: Normal rate and regular rhythm.     Heart sounds: No murmur heard.    No friction rub.  Pulmonary:     Effort: No respiratory distress.     Breath sounds: No stridor.  Rhonchi present. No wheezing.     Comments: Decreased air movement Musculoskeletal:     Cervical back: No rigidity or tenderness.  Neurological:     Mental Status: She is alert.  Psychiatric:        Mood and Affect: Mood normal.    Data Reviewed: CT chest 08/05/2021 reviewed-no infiltrative process  PFT 01/10/2021 with no significant obstructive disease, no significant bronchodilator response, some air trapping, normal diffusing capacity  Assessment/Plan: Acute exacerbation of asthma  COPD asthma overlap  Acute bronchitis  Shortness of breath  Will get a chest x-ray today  Prescription for Augmentin  sent to pharmacy, prescription for prednisone  to be used for 5 days  Continue Breztri   Use nebulizer as needed  Prescription for Hydromet sent in  Tentative follow-up in about 6 weeks  Call with significant concerns  Jennet Epley MD [facility] 12/18/2023, 3:19 PM  CC: Kathryn Kathryn RAMAN, MD  Chest x-ray performed in the office today and reviewed showing no acute infiltrate

## 2024-02-16 ENCOUNTER — Other Ambulatory Visit: Payer: Self-pay | Admitting: Pulmonary Disease

## 2024-02-21 ENCOUNTER — Encounter: Payer: Self-pay | Admitting: Adult Health

## 2024-02-21 ENCOUNTER — Ambulatory Visit: Admitting: Adult Health

## 2024-02-21 VITALS — BP 114/56 | HR 63 | Temp 98.3°F | Ht 62.5 in | Wt 179.4 lb

## 2024-02-21 DIAGNOSIS — M549 Dorsalgia, unspecified: Secondary | ICD-10-CM | POA: Diagnosis not present

## 2024-02-21 DIAGNOSIS — R5381 Other malaise: Secondary | ICD-10-CM | POA: Diagnosis not present

## 2024-02-21 DIAGNOSIS — J4489 Other specified chronic obstructive pulmonary disease: Secondary | ICD-10-CM | POA: Diagnosis not present

## 2024-02-21 DIAGNOSIS — J309 Allergic rhinitis, unspecified: Secondary | ICD-10-CM | POA: Diagnosis not present

## 2024-02-21 DIAGNOSIS — Z23 Encounter for immunization: Secondary | ICD-10-CM

## 2024-02-21 DIAGNOSIS — K7460 Unspecified cirrhosis of liver: Secondary | ICD-10-CM

## 2024-02-21 DIAGNOSIS — G8929 Other chronic pain: Secondary | ICD-10-CM

## 2024-02-21 NOTE — Patient Instructions (Addendum)
 Continue on Breztri  2 puffs Twice daily, rinse after use.  Albuterol  inhaler or neb As needed   Liquid Mucinex DM 2 tsp Twice daily  As needed  cough  Tessalon  Three times a day  for cough As needed   Voice rest , sips of water  to soothe throat.  Continue on Singulair  daily  Continue on Allegra 180mg  daily  Follow up with Dr. Kara 3-4 months with PFT (Pre/Post Spirometry ) and As needed    Flu shot today.

## 2024-02-21 NOTE — Progress Notes (Signed)
 @Patient  ID: Kathryn Hicks, female    DOB: Nov 21, 1955, 68 y.o.   MRN: [id number]  Chief Complaint  Patient presents with   Shortness of Breath    F/U    Referring provider: Ina Marcellus RAMAN, MD  HPI: 68 yo female former smoker followed for COPD asthma overlap and Chronic Cough Medical history significant for fatty liver with cirrhosis   SH:  2 dogs and 1 bird at home   TEST/EVENTS : Reviewed 02/21/2024  Home apnea-link HST, AHI = 0.5, no apnea    PFT January 10, 2021 showed moderate restriction with FEV1 at 76%, ratio 90, FVC 64%, positive bronchodilator response, DLCO 106%.  Chest 01/2023 Clear lungs   CT chest 08/2021 clear lungs   Allegra panel negative, IgE 68  Eos ab -100   Discussed the use of AI scribe software for clinical note transcription with the patient, who gave verbal consent to proceed.  History of Present Illness Kathryn Hicks is a 68 year old female with COPD asthma overlap who presents for follow-up of her respiratory symptoms.  Recent asthmatic bronchitic exacerbation 2 months ago.  Says overall her breathing has improved as she was treated with antibiotics and steroids last visit.  Patient says she feels that she is near her baseline.  She remains on Breztri  twice daily.  She is prone to frequent exacerbations.  Chest x-ray In September 2025 showed clear lungs.   She also uses Tussin DM Max for cough, which she describes as effective, and albuterol  nebulizer. She no longer uses Tessalon  pearls.   She continues to take Singulair  and Allegra for allergies, as she experiences year-round allergies. She has two teacup poodles and a bird at home, which may contribute to her symptoms.  Previous allergy  panel in 2023 was negative.  IgE 68.  Absolute eosinophil count 100  She notes difficulty with physical activities due to shortness of breath, especially when bending down or walking uphill, and attributes some of this to spinal stenosis.  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 She recently had an MRI for her back, which incidentally noted an enlarged spleen. She is scheduled to discuss this finding with her liver specialist later today.  She also mentions a history of smoking cessation years ago      Allergies  Allergen Reactions   Butalbital Hives    Other reaction(s): Other (See Comments) unknown   Vlcpcpv [Daridorexant] Other (See Comments)    Nightmares   Sporanos [Itraconazole] Hives and Rash    Welts full body    Immunization History  Administered Date(s) Administered   Fluad Quad(high Dose 65+) 02/07/2021   INFLUENZA, HIGH DOSE SEASONAL PF 02/21/2024   Influenza,inj,Quad PF,6+ Mos 02/05/2018   Moderna Sars-Covid-2 Vaccination 06/12/2019, 07/17/2019, 01/26/2020   PFIZER(Purple Top)SARS-COV-2 Vaccination 03/02/2021   Pneumococcal Polysaccharide-23 02/05/2018    Past Medical History:  Diagnosis Date   Anxiety    Depression    GERD (gastroesophageal reflux disease)    Hypertension    Hypothyroidism    followed by pcp   Migraines    OA (osteoarthritis)    knees   Pneumonia    hx of    PONV (postoperative nausea and vomiting)    woke up during knee surgery    Wears glasses     Tobacco History: Social History   Tobacco Use  Smoking Status Former   Current packs/day: 0.00   Types:  Cigarettes   Start date: 05/23/1967   Quit date: 05/22/1977   Years since quitting: 46.7   Passive exposure: Past  Smokeless Tobacco Never   Counseling given: Not Answered   Outpatient Medications Prior to Visit  Medication Sig Dispense Refill   albuterol  (PROVENTIL ) (2.5 MG/3ML) 0.083% nebulizer solution Take 3 mLs (2.5 mg total) by nebulization every 6 (six) hours as needed for wheezing or shortness of breath. 120 mL 5   albuterol  (VENTOLIN  HFA) 108 (90 Base) MCG/ACT inhaler Inhale 2 puffs into the lungs every 6 (six) hours as needed for wheezing or  shortness of breath. 8 g 6   ALPRAZolam  (XANAX ) 1 MG tablet Take 1 mg by mouth at bedtime. (Patient taking differently: Take 1 mg by mouth at bedtime as needed.)     atenolol (TENORMIN) 25 MG tablet Take 25 mg by mouth at bedtime.     Biotin 5000 MCG TABS Take 5,000 mcg by mouth daily.      Brimonidine  Tartrate (LUMIFY ) 0.025 % SOLN Place 1 drop into both eyes daily as needed (dry eyes).     Budeson-Glycopyrrol-Formoterol (BREZTRI  AEROSPHERE) 160-9-4.8 MCG/ACT AERO Inhale 2 puffs into the lungs 2 (two) times daily. 32.1 g 3   cholecalciferol (VITAMIN D3) 25 MCG (1000 UT) tablet Take 1,000 Units by mouth daily.     esomeprazole (NEXIUM) 20 MG capsule Take 20 mg by mouth daily as needed.      estrogens , conjugated, (PREMARIN ) 0.625 MG tablet Take 0.625 mg by mouth daily. Take daily for 21 days then do not take for 7 days.     fexofenadine (ALLEGRA) 180 MG tablet Take 180 mg by mouth daily.     furosemide (LASIX) 20 MG tablet Take 20 mg by mouth daily as needed for fluid or edema.     Lemborexant (DAYVIGO) 10 MG TABS Take 1 tablet by mouth at bedtime as needed (for sleep).     levothyroxine  (SYNTHROID , LEVOTHROID) 25 MCG tablet Take 25 mcg by mouth daily before breakfast.     montelukast  (SINGULAIR ) 10 MG tablet TAKE 1 TABLET AT BEDTIME 90 tablet 3   Multiple Vitamin (MULTIVITAMIN) tablet Take 1 tablet by mouth daily.     olmesartan -hydrochlorothiazide  (BENICAR  HCT) 40-25 MG tablet Take 1 tablet by mouth daily.     traMADol (ULTRAM) 50 MG tablet Take 50 mg by mouth every 6 (six) hours as needed.     HYDROcodone  bit-homatropine (HYDROMET) 5-1.5 MG/5ML syrup Take 5 mLs by mouth every 6 (six) hours as needed for cough. 120 mL 0   amoxicillin -clavulanate (AUGMENTIN ) 875-125 MG tablet Take 1 tablet by mouth 2 (two) times daily. 14 tablet 0   No facility-administered medications prior to visit.     Review of Systems:   Constitutional:   No  weight loss, night sweats,  Fevers, chills, +fatigue, or   lassitude.  HEENT:   No headaches,  Difficulty swallowing,  Tooth/dental problems, or  Sore throat,                No sneezing, itching, ear ache, +nasal congestion, post nasal drip,   CV:  No chest pain,  Orthopnea, PND, swelling in lower extremities, anasarca, dizziness, palpitations, syncope.   GI  No heartburn, indigestion, abdominal pain, nausea, vomiting, diarrhea, change in bowel habits, loss of appetite, bloody stools.   Resp: .  No chest wall deformity  Skin: no rash or lesions.  GU: no dysuria, change in color of urine, no urgency or frequency.  No flank  pain, no hematuria   MS:  No joint pain or swelling.  No decreased range of motion.  + back pain.    Physical Exam  BP (!) 114/56   Pulse 63   Temp 98.3 F (36.8 C)   Ht 5' 2.5 (1.588 m) Comment: Per pt  Wt 179 lb 6.4 oz (81.4 kg)   SpO2 95% Comment: RA  BMI 32.29 kg/m   GEN: A/Ox3; pleasant , NAD, well nourished    HEENT:  [place]/AT,   NOSE-clear, THROAT-clear, no lesions, no postnasal drip or exudate noted.   NECK:  Supple w/ fair ROM; no JVD; normal carotid impulses w/o bruits; no thyromegaly or nodules palpated; no lymphadenopathy.    RESP  Clear  P & A; w/o, wheezes/ rales/ or rhonchi. no accessory muscle use, no dullness to percussion  CARD:  RRR, no m/r/g, no peripheral edema, pulses intact, no cyanosis or clubbing.  GI:   Soft & nt; nml bowel sounds; no organomegaly or masses detected.   Musco: Warm bil, no deformities or joint swelling noted.   Neuro: alert, no focal deficits noted.    Skin: Warm, no lesions or rashes    Lab Results:Reviewed 02/21/2024   CBC    Component Value Date/Time   WBC 10.4 05/10/2021 1137   RBC 4.45 05/10/2021 1137   HGB 14.0 05/10/2021 1137   HCT 42.0 05/10/2021 1137   PLT 211.0 05/10/2021 1137   MCV 94.4 05/10/2021 1137   MCH 32.7 07/14/2020 0321   MCHC 33.3 05/10/2021 1137   RDW 13.6 05/10/2021 1137   LYMPHSABS 2.3 05/10/2021 1137   MONOABS 0.7 05/10/2021  1137   EOSABS 0.1 05/10/2021 1137   BASOSABS 0.0 05/10/2021 1137    BMET    Component Value Date/Time   NA 135 07/14/2020 0321   K 4.0 07/14/2020 0321   CL 100 07/14/2020 0321   CO2 26 07/14/2020 0321   GLUCOSE 145 (H) 07/14/2020 0321   BUN 15 07/14/2020 0321   CREATININE 0.81 07/14/2020 0321   CALCIUM 9.0 07/14/2020 0321   GFRNONAA >60 07/14/2020 0321   GFRAA >60 06/13/2018 0530    BNP No results found for: BNP  ProBNP No results found for: PROBNP  Imaging: No results found.  Administration History     None          Latest Ref Rng & Units 01/10/2021    3:29 PM  PFT Results  FVC-Pre L 1.73   FVC-Predicted Pre % 59   FVC-Post L 1.87   FVC-Predicted Post % 64   Pre FEV1/FVC % % 87   Post FEV1/FCV % % 90   FEV1-Pre L 1.51   FEV1-Predicted Pre % 67   FEV1-Post L 1.70   DLCO uncorrected ml/min/mmHg 19.72   DLCO UNC% % 106   DLCO corrected ml/min/mmHg 19.72   DLCO COR %Predicted % 106   DLVA Predicted % 126   TLC L 4.42   TLC % Predicted % 92   RV % Predicted % 129     No results found for: NITRICOXIDE      No data to display              Assessment & Plan:   Assessment and Plan Assessment & Plan Chronic obstructive pulmonary disease (COPD) /Asthma overlap syndrome   Asthma and COPD overlap syndrome-prone to frequent exacerbations. A previous exacerbation required antibiotics and prednisone , which resolved. Current symptoms include cough and congestion, likely due to sinus drainage and allergies.  Recent imaging shows no evidence of chronic bronchitis or interstitial changes.  Continue Breztri  two puffs twice daily with rinsing after use, nebulizer as needed, and Tussin DM Max for cough. Avoid prednisone . Repeat pulmonary function test in 3-4 months. Discuss potential triggers at home, such as pets and birds, and consider reducing exposure.  We discussed with recurrent exacerbations to add in biologic therapy going forward in order to  decrease exacerbations and frequent steroid use.  Allergic rhinitis   Chronic allergic rhinitis presents with year-round symptoms, managed with Singulair  and Allegra. Potential triggers include pets and birds at home, though a previous allergy  panel did not show significant positive results. Continue Singulair  and Allegra as needed. Consider reducing exposure to pets and birds. Use allergy  filters and keep pets away from sleeping areas.  Immunization: Influenza vaccine   Administered influenza vaccine today.  Physical deconditioning.-Limited due to chronic back pain  activity as tolerated.  Chronic back pain-ongoing..  Continue follow-up with orthopedics  Cirrhosis-reported is stable continue follow-up with liver specialist  Plan  Patient Instructions  Continue on Breztri  2 puffs Twice daily, rinse after use.  Albuterol  inhaler or neb As needed   Liquid Mucinex DM 2 tsp Twice daily  As needed  cough  Tessalon  Three times a day  for cough As needed   Voice rest , sips of water  to soothe throat.  Continue on Singulair  daily  Continue on Allegra 180mg  daily  Follow up with Dr. Kara 3-4 months with PFT (Pre/Post Spirometry ) and As needed    Flu shot today.       Madelin Stank, NP 02/21/2024  I

## 2024-02-22 ENCOUNTER — Other Ambulatory Visit (HOSPITAL_BASED_OUTPATIENT_CLINIC_OR_DEPARTMENT_OTHER): Payer: Self-pay | Admitting: Nurse Practitioner

## 2024-02-22 DIAGNOSIS — K766 Portal hypertension: Secondary | ICD-10-CM

## 2024-02-22 DIAGNOSIS — K7581 Nonalcoholic steatohepatitis (NASH): Secondary | ICD-10-CM

## 2024-02-22 DIAGNOSIS — Z9189 Other specified personal risk factors, not elsewhere classified: Secondary | ICD-10-CM

## 2024-03-06 ENCOUNTER — Other Ambulatory Visit (HOSPITAL_BASED_OUTPATIENT_CLINIC_OR_DEPARTMENT_OTHER): Admitting: Radiology

## 2024-03-08 ENCOUNTER — Other Ambulatory Visit: Payer: Self-pay | Admitting: Pulmonary Disease

## 2024-03-08 DIAGNOSIS — J4489 Other specified chronic obstructive pulmonary disease: Secondary | ICD-10-CM

## 2024-03-13 ENCOUNTER — Telehealth: Payer: Self-pay

## 2024-03-13 MED ORDER — BENZONATATE 200 MG PO CAPS: 200.0000 mg | ORAL_CAPSULE | Freq: Three times a day (TID) | ORAL | 1 refills | Status: DC | PRN

## 2024-03-13 MED ORDER — HYDROCODONE BIT-HOMATROP MBR 5-1.5 MG/5ML PO SOLN: 5.0000 mL | Freq: Every evening | ORAL | 0 refills | Status: DC | PRN

## 2024-03-13 NOTE — Telephone Encounter (Signed)
 Copied from CRM [id number]. Topic: Clinical - Medical Advice >> Mar 11, 2024  9:03 AM Devaughn RAMAN wrote: Reason for CRM: Pt calling regarding her cough and pt was placed on 2 medications, pt stated she has COPD and asthma, and she is getting over the Flu and it is starting to clear up. Pt stated her cough is really bad and she cannot stop coughing and she would like recommendations regarding this.  Please advise recommendations

## 2024-03-13 NOTE — Telephone Encounter (Signed)
 Sorry to hear the patient had the flu. Continue on current maintenance regimen along with fluids and rest Continue on Breztri  2 puffs Twice daily, rinse after use.  Albuterol  inhaler or neb As needed   Liquid Mucinex DM 2 tsp Twice daily  As needed  cough  Tessalon  Three times a day  for cough As needed   Voice rest , sips of water  to soothe throat.   Will send in Hydromet 1 teaspoon at bedtime as needed for severe cough.  Use with caution as can cause sedation and balance issues.  Do not use with other sedating medications.  PMP reviewed   Will need ov if not improving  Please contact office for sooner follow up if symptoms do not improve or worsen or seek emergency care

## 2024-03-14 NOTE — Telephone Encounter (Signed)
 I called and spoke to pt. Pt informed of Tammy's note and verbalized understanding. NFN

## 2024-03-18 ENCOUNTER — Encounter: Payer: Self-pay | Admitting: Pulmonary Disease

## 2024-03-18 ENCOUNTER — Ambulatory Visit: Payer: Self-pay

## 2024-03-18 ENCOUNTER — Ambulatory Visit: Admitting: Pulmonary Disease

## 2024-03-18 VITALS — BP 115/68 | HR 92 | Ht 62.5 in | Wt 175.6 lb

## 2024-03-18 DIAGNOSIS — K7460 Unspecified cirrhosis of liver: Secondary | ICD-10-CM

## 2024-03-18 DIAGNOSIS — R0602 Shortness of breath: Secondary | ICD-10-CM

## 2024-03-18 DIAGNOSIS — J4489 Other specified chronic obstructive pulmonary disease: Secondary | ICD-10-CM

## 2024-03-18 MED ORDER — AMOXICILLIN-POT CLAVULANATE 875-125 MG PO TABS: 1.0000 | ORAL_TABLET | Freq: Two times a day (BID) | ORAL | 0 refills | Status: DC

## 2024-03-18 MED ORDER — PREDNISONE 20 MG PO TABS: 40.0000 mg | ORAL_TABLET | Freq: Every day | ORAL | 0 refills | Status: AC

## 2024-03-18 NOTE — Patient Instructions (Signed)
 I put in for Augmentin  and prednisone  for you  Let us  know if you are not feeling any better after completing course of treatment  Continue to use cough medicine to help your symptoms  Call us  with significant concerns   Keep previous follow-up appointment

## 2024-03-18 NOTE — Telephone Encounter (Signed)
 FYI Only or Action Required?: FYI only for provider: appointment scheduled on 03/18/2024.  Patient is followed in Pulmonology for n/a, last seen on 02/21/2024 by Parrett, Madelin RAMAN, NP.  Called Nurse Triage reporting Shortness of Breath.  Symptoms began a week ago.  Interventions attempted: Nebulizer treatments.  Symptoms are: gradually worsening.  Triage Disposition: See HCP Within 4 Hours (Or PCP Triage)  Patient/caregiver understands and will follow disposition?: Yes    Copied from CRM #[id number]. Topic: Clinical - Red Word Triage >> Mar 18, 2024  7:47 AM Benton KIDD wrote: [facility] that prompted transfer to Nurse Triage: on the 20th i got my flu shot few days after i got sick .not getting better cant hardly breathneed prednisone  and antibiotics  Dr kara Reason for Disposition  [1] MILD difficulty breathing (e.g., minimal/no SOB at rest, SOB with walking, pulse < 100) AND [2] NEW-onset or WORSE than normal  Additional Information  Commented on: [1] MODERATE difficulty breathing (e.g., speaks in phrases, SOB even at rest, pulse 100-120) AND [2] NEW-onset or WORSE than normal    95 O2 on room air  Answer Assessment - Initial Assessment Questions 1. RESPIRATORY STATUS: Describe your breathing? (e.g., wheezing, shortness of breath, unable to speak, severe coughing)      SOB, coughing, speaking in phrases at times, wheezing 2. ONSET: When did this breathing problem begin?      Week and worsening 3. PATTERN Does the difficult breathing come and go, or has it been constant since it started?      Constant then comes and goes after treatments 4. SEVERITY: How bad is your breathing? (e.g., mild, moderate, severe)      moderate 5. RECURRENT SYMPTOM: Have you had difficulty breathing before? If Yes, ask: When was the last time? and What happened that time?      yes 6. CARDIAC HISTORY: Do you have any history of heart disease? (e.g., heart attack, angina, bypass surgery,  angioplasty)      na 7. LUNG HISTORY: Do you have any history of lung disease?  (e.g., pulmonary embolus, asthma, emphysema)     yes 8. CAUSE: What do you think is causing the breathing problem?      na 9. OTHER SYMPTOMS: Do you have any other symptoms? (e.g., chest pain, cough, dizziness, fever, runny nose)     Cough, wheezing 10. O2 SATURATION MONITOR:  Do you use an oxygen  saturation monitor (pulse oximeter) at home? If Yes, ask: What is your reading (oxygen  level) today? What is your usual oxygen  saturation reading? (e.g., 95%)       95 11. PREGNANCY: Is there any chance you are pregnant? When was your last menstrual period?       na 12. TRAVEL: Have you traveled out of the country in the last month? (e.g., travel history, exposures)       Na  Pt called in requesting prednisone  and ABX: nurse recommended pt come in to be seen: appt scheduled.  Protocols used: Breathing Difficulty-A-AH

## 2024-03-18 NOTE — Progress Notes (Signed)
 Kathryn Hicks    [id number]    22-Oct-1955  Primary Care Physician:Holt, Marcellus RAMAN, MD  Referring Physician: Ina Marcellus RAMAN, MD [address] [place] [address],   Chief complaint:   Patient being seen for an acute visit  HPI:  Patient with asthma COPD  Started feeling sick following receiving the flu shot Was using an over-the-counter cough medicine, a day of steroids and felt a little bit better but symptoms are worse now  Cough, congestion, yellow-green phlegm, no fevers Some chest discomfort with persistent coughing Not able to sleep at night  Underlying history of asthma COPD overlap, compliant with Breztri  Struggles with allergies with a relatively controlled  At baseline does have some limitations with activities with shortness of breath, history of spinal stenosis History of nonalcoholic fatty liver disease  Reformed smoker  Outpatient Encounter Medications as of 03/18/2024  Medication Sig   albuterol  (PROVENTIL ) (2.5 MG/3ML) 0.083% nebulizer solution Take 3 mLs (2.5 mg total) by nebulization every 6 (six) hours as needed for wheezing or shortness of breath.   albuterol  (VENTOLIN  HFA) 108 (90 Base) MCG/ACT inhaler USE 2 INHALATIONS EVERY 4 TO 6 HOURS AS NEEDED   ALPRAZolam  (XANAX ) 1 MG tablet Take 1 mg by mouth at bedtime. (Patient taking differently: Take 1 mg by mouth at bedtime as needed.)   amoxicillin -clavulanate (AUGMENTIN ) 875-125 MG tablet Take 1 tablet by mouth 2 (two) times daily.   atenolol (TENORMIN) 25 MG tablet Take 25 mg by mouth at bedtime.   benzonatate  (TESSALON ) 200 MG capsule Take 1 capsule (200 mg total) by mouth 3 (three) times daily as needed.   Biotin 5000 MCG TABS Take 5,000 mcg by mouth daily.    BREZTRI  AEROSPHERE 160-9-4.8 MCG/ACT AERO inhaler USE 2 INHALATIONS TWICE A DAY   Brimonidine  Tartrate (LUMIFY ) 0.025 % SOLN Place 1 drop into both eyes daily as needed (dry eyes).   cholecalciferol (VITAMIN D3) 25 MCG (1000 UT)  tablet Take 1,000 Units by mouth daily.   esomeprazole (NEXIUM) 20 MG capsule Take 20 mg by mouth daily as needed.    estrogens , conjugated, (PREMARIN ) 0.625 MG tablet Take 0.625 mg by mouth daily. Take daily for 21 days then do not take for 7 days.   fexofenadine (ALLEGRA) 180 MG tablet Take 180 mg by mouth daily.   furosemide (LASIX) 20 MG tablet Take 20 mg by mouth daily as needed for fluid or edema.   HYDROcodone  bit-homatropine (HYDROMET) 5-1.5 MG/5ML syrup Take 5 mLs by mouth at bedtime as needed for cough.   Lemborexant (DAYVIGO) 10 MG TABS Take 1 tablet by mouth at bedtime as needed (for sleep).   levothyroxine  (SYNTHROID , LEVOTHROID) 25 MCG tablet Take 25 mcg by mouth daily before breakfast.   montelukast  (SINGULAIR ) 10 MG tablet TAKE 1 TABLET AT BEDTIME   Multiple Vitamin (MULTIVITAMIN) tablet Take 1 tablet by mouth daily.   olmesartan -hydrochlorothiazide  (BENICAR  HCT) 40-25 MG tablet Take 1 tablet by mouth daily.   predniSONE  (DELTASONE ) 20 MG tablet Take 2 tablets (40 mg total) by mouth daily with breakfast for 5 days.   traMADol (ULTRAM) 50 MG tablet Take 50 mg by mouth every 6 (six) hours as needed.   No facility-administered encounter medications on file as of 03/18/2024.    Allergies as of 03/18/2024 - Review Complete 03/18/2024  Allergen Reaction Noted   Butalbital Hives 07/05/2015   Quviviq [daridorexant] Other (See Comments) 02/20/2023   Sporanos [itraconazole] Hives and Rash 05/14/2018  Past Medical History:  Diagnosis Date   Anxiety    Depression    GERD (gastroesophageal reflux disease)    Hypertension    Hypothyroidism    followed by pcp   Migraines    OA (osteoarthritis)    knees   Pneumonia    hx of    PONV (postoperative nausea and vomiting)    woke up during knee surgery    Wears glasses     Past Surgical History:  Procedure Laterality Date   ABDOMINAL HYSTERECTOMY  2003    w/  Bilateral Salpingo-oophorectomy   CARPAL TUNNEL RELEASE Left 2014    CESAREAN SECTION  x2  last one 1981   COLONOSCOPY     KNEE ARTHROSCOPY Left 06-21-2017   dr heide   LAPAROSCOPIC CHOLECYSTECTOMY  2004   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   Procedure: TOTAL KNEE ARTHROPLASTY;  Surgeon: Heide Ingle, MD;  Location: [facility];  Service: Orthopedics;  Laterality: Left;    TOTAL KNEE ARTHROPLASTY Right 07/13/2020   Procedure: TOTAL KNEE ARTHROPLASTY;  Surgeon: Ernie Cough, MD;  Location: [facility];  Service: Orthopedics;  Laterality: Right;  70 mins    Family History  Problem Relation Age of Onset   Prostate cancer Father    Heart attack Father    Wilm's tumor Daughter     Social History   Socioeconomic History   Marital status: Married    Spouse name: Not on file   Number of children: Not on file   Years of education: Not on file   Highest education level: Not on file  Occupational History   Not on file  Tobacco Use   Smoking status: Former    Current packs/day: 0.00    Types: Cigarettes    Start date: 05/23/1967    Quit date: 05/22/1977    Years since quitting: 46.8    Passive exposure: Past   Smokeless tobacco: Never  Vaping Use   Vaping status: Never Used  Substance and Sexual Activity   Alcohol use: Never    Comment: rare   Drug use: Never   Sexual activity: Not on file  Other Topics Concern   Not on file  Social History Narrative   Not on file   Social Drivers of Health   Tobacco Use: Medium Risk (03/18/2024)   Patient History    Smoking Tobacco Use: Former    Smokeless Tobacco Use: Never    Passive Exposure: Past  Physicist, Medical Strain: Not on file  Food Insecurity: Low Risk (02/21/2024)   Received from [facility]   Epic    Within the past 12 months, you worried that your food would run out before you got money to buy more: Never true    Within the past 12 months, the food you bought just didn't last and you didn't have money to get more. : Never true  Transportation Needs: No Transportation Needs  (02/21/2024)   Received from [facility]    In the past 12 months, has lack of reliable transportation kept you from medical appointments, meetings, work or from getting things needed for daily living? : No  Physical Activity: Not on file  Stress: Not on file  Social Connections: Not on file  Intimate Partner Violence: Not on file  Depression (EYV7-0): Not on file  Alcohol Screen: Not on file  Housing: Low Risk (02/21/2024)   Received from [facility]   Epic    What is your living situation  today?: I have a steady place to live    Think about the place you live. Do you have problems with any of the following? Choose all that apply:: None/None on this list  Utilities: Low Risk (02/21/2024)   Received from [facility]   Utilities    In the past 12 months has the electric, gas, oil, or water  company threatened to shut off services in your home? : No  Health Literacy: Not on file    Review of Systems  Respiratory:  Positive for cough, chest tightness and wheezing.   Cardiovascular:  Negative for chest pain.    Vitals:   03/18/24 1053  BP: 115/68  Pulse: 92  SpO2: 98%     Physical Exam Constitutional:      Appearance: She is obese.  HENT:     Head: Normocephalic.     Nose: Nose normal.     Mouth/Throat:     Mouth: Mucous membranes are moist.  Eyes:     General: No scleral icterus.    Pupils: Pupils are equal, round, and reactive to light.  Cardiovascular:     Rate and Rhythm: Normal rate and regular rhythm.     Heart sounds: No murmur heard.    No friction rub.  Pulmonary:     Effort: No respiratory distress.     Breath sounds: No stridor. Rhonchi present. No wheezing.  Musculoskeletal:     Cervical back: No rigidity or tenderness.  Skin:    General: Skin is warm.  Neurological:     General: No focal deficit present.     Mental Status: She is alert.  Psychiatric:        Mood and Affect: Mood normal.    Data Reviewed: PFT from 2022  reviewed  CT chest 08/05/2021 reviewed showing no infiltrative process, no adenopathy  Assessment/Plan: Asthma COPD overlap with exacerbation  Bronchitic exacerbation of COPD  History of allergic rhinitis  History of liver cirrhosis    Prescription for Augmentin  and prednisone  called to pharmacy  Encouraged to continue using Breztri   Continue bronchodilator treatments  Encouraged to keep already scheduled follow-up visit  Call us  with significant concerns     Jennet Epley MD [facility] 03/18/2024, 11:13 AM  CC: Ina Marcellus RAMAN, MD

## 2024-04-04 ENCOUNTER — Ambulatory Visit (HOSPITAL_BASED_OUTPATIENT_CLINIC_OR_DEPARTMENT_OTHER)
Admission: RE | Admit: 2024-04-04 | Discharge: 2024-04-04 | Disposition: A | Discharge status: Home / Self Care | Source: Ambulatory Visit | Attending: Nurse Practitioner | Admitting: Nurse Practitioner

## 2024-04-04 DIAGNOSIS — K7469 Other cirrhosis of liver: Secondary | ICD-10-CM

## 2024-04-04 DIAGNOSIS — K7581 Nonalcoholic steatohepatitis (NASH): Secondary | ICD-10-CM

## 2024-04-04 DIAGNOSIS — Z9189 Other specified personal risk factors, not elsewhere classified: Secondary | ICD-10-CM | POA: Diagnosis not present

## 2024-04-04 DIAGNOSIS — K766 Portal hypertension: Secondary | ICD-10-CM

## 2024-04-21 ENCOUNTER — Ambulatory Visit (HOSPITAL_COMMUNITY): Payer: Self-pay | Admitting: Physician Assistant

## 2024-05-19 ENCOUNTER — Other Ambulatory Visit (HOSPITAL_COMMUNITY)

## 2024-05-29 ENCOUNTER — Inpatient Hospital Stay (HOSPITAL_COMMUNITY): Admit: 2024-05-29 | Admitting: Orthopedic Surgery

## 2024-06-16 ENCOUNTER — Encounter

## 2024-06-16 ENCOUNTER — Ambulatory Visit: Admitting: Pulmonary Disease
